# Patient Record
Sex: Female | Born: 1980 | Race: Black or African American | Hispanic: No | Marital: Single | Smoking: Never smoker
Health system: Southern US, Community
[De-identification: ages and names within clinical notes are randomized; demographics above are authoritative.]

## PROBLEM LIST (undated history)

## (undated) DIAGNOSIS — E119 Type 2 diabetes mellitus without complications: Secondary | ICD-10-CM

## (undated) DIAGNOSIS — R112 Nausea with vomiting, unspecified: Secondary | ICD-10-CM

## (undated) DIAGNOSIS — F32A Depression, unspecified: Secondary | ICD-10-CM

## (undated) DIAGNOSIS — J45909 Unspecified asthma, uncomplicated: Secondary | ICD-10-CM

## (undated) DIAGNOSIS — F329 Major depressive disorder, single episode, unspecified: Secondary | ICD-10-CM

## (undated) DIAGNOSIS — K219 Gastro-esophageal reflux disease without esophagitis: Secondary | ICD-10-CM

## (undated) HISTORY — PX: TUBAL LIGATION: SHX77

---
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
## 2015-09-25 ENCOUNTER — Encounter: Payer: Self-pay | Admitting: Physician Assistant

## 2015-09-25 ENCOUNTER — Ambulatory Visit: Payer: Self-pay | Admitting: Physician Assistant

## 2015-09-25 VITALS — BP 120/89 | HR 92 | Temp 98.3°F

## 2015-09-25 DIAGNOSIS — J069 Acute upper respiratory infection, unspecified: Secondary | ICD-10-CM

## 2015-09-25 DIAGNOSIS — R110 Nausea: Secondary | ICD-10-CM

## 2015-09-25 MED ORDER — AZITHROMYCIN 250 MG PO TABS: ORAL_TABLET | ORAL | Status: DC

## 2015-09-25 MED ORDER — FLUTICASONE PROPIONATE 50 MCG/ACT NA SUSP: 2.0000 | Freq: Every day | NASAL | Status: DC

## 2015-09-25 MED ORDER — BENZONATATE 100 MG PO CAPS: 200.0000 mg | ORAL_CAPSULE | Freq: Three times a day (TID) | ORAL | Status: DC | PRN

## 2015-09-25 MED ORDER — ONDANSETRON HCL 4 MG PO TABS: 4.0000 mg | ORAL_TABLET | Freq: Three times a day (TID) | ORAL | Status: DC | PRN

## 2015-09-25 NOTE — Progress Notes (Signed)
S: C/o runny nose and congestion for 3 weeks, no fever, chills now but did have same at beginning of sx; denies cp/sob, v/d; mucus was green this am but clear throughout the day, cough is sporadic, is having some nausea and having to clear her throat alot  Using otc meds without relief  O: PE: perrl eomi, normocephalic, tms dull, nasal mucosa red and swollen, throat injected, neck supple no lymph, lungs c t a, cv rrr, neuro intact, cough is dry  A:  Acute uri, nausea without vomiting  P: zpack, flonase, tessalon perls, zofran drink fluids, continue regular meds , use otc meds of choice, return if not improving in 5 days, return earlier if worsening

## 2016-01-01 ENCOUNTER — Other Ambulatory Visit: Payer: Self-pay | Admitting: Physician Assistant

## 2016-07-15 ENCOUNTER — Encounter: Payer: Self-pay | Admitting: Physician Assistant

## 2016-07-15 ENCOUNTER — Ambulatory Visit: Payer: Self-pay | Admitting: Physician Assistant

## 2016-07-15 VITALS — BP 117/80 | HR 85 | Temp 98.3°F

## 2016-07-15 DIAGNOSIS — R110 Nausea: Secondary | ICD-10-CM

## 2016-07-15 DIAGNOSIS — Z299 Encounter for prophylactic measures, unspecified: Secondary | ICD-10-CM

## 2016-07-15 LAB — POCT URINALYSIS DIPSTICK
Bilirubin, UA: NEGATIVE
GLUCOSE UA: NEGATIVE
KETONES UA: NEGATIVE
LEUKOCYTES UA: NEGATIVE
Nitrite, UA: NEGATIVE
Protein, UA: NEGATIVE
SPEC GRAV UA: 1.025
Urobilinogen, UA: 1
pH, UA: 6

## 2016-07-15 MED ORDER — ONDANSETRON HCL 4 MG PO TABS: 4.0000 mg | ORAL_TABLET | Freq: Three times a day (TID) | ORAL | 0 refills | Status: DC | PRN

## 2016-07-15 NOTE — Progress Notes (Signed)
S: c/o nausea, bump on upper lid, yesterday bump was worse , has cleared up today, nausea on and off, worse around certain foods, no ruq pain, similar sx last year at this time, no actual vomiting, no diarrhea, no abdominal pain, hx of hyst in 2011, hx of tachycardia several years ago which resolved, family hx dm, htn, thyroid problems, breast ca, leukemia  O: vitals wnl, nad, ENT wnl, neck supple no lymph, lungs c t a, cv rrr, abd soft nontender bs normal all 4 quads, ua wnl  A: nausea,   P: zofran 4mg  , fasting labs drawn today

## 2016-07-16 LAB — CMP12+LP+TP+TSH+6AC+CBC/D/PLT
A/G RATIO: 1.2 (ref 1.2–2.2)
ALT: 29 IU/L (ref 0–32)
AST: 25 IU/L (ref 0–40)
Albumin: 4.3 g/dL (ref 3.5–5.5)
Alkaline Phosphatase: 91 IU/L (ref 39–117)
BASOS ABS: 0 10*3/uL (ref 0.0–0.2)
BUN / CREAT RATIO: 15 (ref 9–23)
BUN: 12 mg/dL (ref 6–20)
Basos: 0 %
Bilirubin Total: <0.2 mg/dL (ref 0.0–1.2)
CHLORIDE: 101 mmol/L (ref 96–106)
CHOLESTEROL TOTAL: 257 mg/dL — AB (ref 100–199)
CREATININE: 0.81 mg/dL (ref 0.57–1.00)
Calcium: 9.6 mg/dL (ref 8.7–10.2)
Chol/HDL Ratio: 3.6 ratio units (ref 0.0–4.4)
EOS (ABSOLUTE): 0.2 10*3/uL (ref 0.0–0.4)
EOS: 2 %
Estimated CHD Risk: 0.6 times avg. (ref 0.0–1.0)
Free Thyroxine Index: 2.2 (ref 1.2–4.9)
GFR calc Af Amer: 110 mL/min/{1.73_m2} (ref >59)
GFR, EST NON AFRICAN AMERICAN: 95 mL/min/{1.73_m2} (ref >59)
GGT: 26 IU/L (ref 0–60)
GLUCOSE: 104 mg/dL — AB (ref 65–99)
Globulin, Total: 3.6 g/dL (ref 1.5–4.5)
HDL: 71 mg/dL (ref >39)
Hematocrit: 40 % (ref 34.0–46.6)
Hemoglobin: 12.6 g/dL (ref 11.1–15.9)
IRON: 43 ug/dL (ref 27–159)
Immature Grans (Abs): 0 10*3/uL (ref 0.0–0.1)
Immature Granulocytes: 0 %
LDH: 233 IU/L — ABNORMAL HIGH (ref 119–226)
LDL Calculated: 168 mg/dL — ABNORMAL HIGH (ref 0–99)
LYMPHS ABS: 3.5 10*3/uL — AB (ref 0.7–3.1)
Lymphs: 30 %
MCH: 24.4 pg — AB (ref 26.6–33.0)
MCHC: 31.5 g/dL (ref 31.5–35.7)
MCV: 78 fL — ABNORMAL LOW (ref 79–97)
MONOS ABS: 0.6 10*3/uL (ref 0.1–0.9)
Monocytes: 5 %
Neutrophils Absolute: 7.5 10*3/uL — ABNORMAL HIGH (ref 1.4–7.0)
Neutrophils: 63 %
PHOSPHORUS: 3.9 mg/dL (ref 2.5–4.5)
PLATELETS: 306 10*3/uL (ref 150–379)
Potassium: 4.7 mmol/L (ref 3.5–5.2)
RBC: 5.16 x10E6/uL (ref 3.77–5.28)
RDW: 16.8 % — ABNORMAL HIGH (ref 12.3–15.4)
Sodium: 141 mmol/L (ref 134–144)
T3 UPTAKE RATIO: 26 % (ref 24–39)
T4 TOTAL: 8.4 ug/dL (ref 4.5–12.0)
TOTAL PROTEIN: 7.9 g/dL (ref 6.0–8.5)
TSH: 3.02 u[IU]/mL (ref 0.450–4.500)
Triglycerides: 90 mg/dL (ref 0–149)
URIC ACID: 7.5 mg/dL — AB (ref 2.5–7.1)
VLDL Cholesterol Cal: 18 mg/dL (ref 5–40)
WBC: 11.9 10*3/uL — ABNORMAL HIGH (ref 3.4–10.8)

## 2016-07-16 LAB — VITAMIN D 25 HYDROXY (VIT D DEFICIENCY, FRACTURES): VIT D 25 HYDROXY: 7.1 ng/mL — AB (ref 30.0–100.0)

## 2016-07-16 LAB — HGB A1C W/O EAG: HEMOGLOBIN A1C: 6 % — AB (ref 4.8–5.6)

## 2016-07-17 ENCOUNTER — Encounter: Payer: Self-pay | Admitting: Emergency Medicine

## 2016-09-13 ENCOUNTER — Encounter (HOSPITAL_COMMUNITY): Payer: Self-pay | Admitting: *Deleted

## 2016-09-13 ENCOUNTER — Emergency Department (HOSPITAL_COMMUNITY): Payer: Managed Care, Other (non HMO)

## 2016-09-13 ENCOUNTER — Emergency Department (HOSPITAL_COMMUNITY)
Admission: EM | Admit: 2016-09-13 | Discharge: 2016-09-13 | Disposition: A | Discharge status: Home / Self Care | Payer: Managed Care, Other (non HMO) | Attending: Emergency Medicine | Admitting: Emergency Medicine

## 2016-09-13 DIAGNOSIS — R05 Cough: Secondary | ICD-10-CM | POA: Diagnosis not present

## 2016-09-13 DIAGNOSIS — J45909 Unspecified asthma, uncomplicated: Secondary | ICD-10-CM | POA: Diagnosis not present

## 2016-09-13 DIAGNOSIS — R9431 Abnormal electrocardiogram [ECG] [EKG]: Secondary | ICD-10-CM | POA: Diagnosis not present

## 2016-09-13 DIAGNOSIS — R0600 Dyspnea, unspecified: Secondary | ICD-10-CM | POA: Diagnosis not present

## 2016-09-13 DIAGNOSIS — R0602 Shortness of breath: Secondary | ICD-10-CM | POA: Diagnosis present

## 2016-09-13 DIAGNOSIS — R059 Cough, unspecified: Secondary | ICD-10-CM

## 2016-09-13 HISTORY — DX: Unspecified asthma, uncomplicated: J45.909

## 2016-09-13 LAB — BASIC METABOLIC PANEL
Anion gap: 10 (ref 5–15)
BUN: 10 mg/dL (ref 6–20)
CALCIUM: 9.1 mg/dL (ref 8.9–10.3)
CO2: 23 mmol/L (ref 22–32)
CREATININE: 0.94 mg/dL (ref 0.44–1.00)
Chloride: 106 mmol/L (ref 101–111)
Glucose, Bld: 123 mg/dL — ABNORMAL HIGH (ref 65–99)
Potassium: 4 mmol/L (ref 3.5–5.1)
SODIUM: 139 mmol/L (ref 135–145)

## 2016-09-13 LAB — CBC
HCT: 40.7 % (ref 36.0–46.0)
Hemoglobin: 12.7 g/dL (ref 12.0–15.0)
MCH: 24.9 pg — ABNORMAL LOW (ref 26.0–34.0)
MCHC: 31.2 g/dL (ref 30.0–36.0)
MCV: 79.8 fL (ref 78.0–100.0)
PLATELETS: 310 10*3/uL (ref 150–400)
RBC: 5.1 MIL/uL (ref 3.87–5.11)
RDW: 15.4 % (ref 11.5–15.5)
WBC: 13.3 10*3/uL — AB (ref 4.0–10.5)

## 2016-09-13 LAB — I-STAT TROPONIN, ED
TROPONIN I, POC: 0 ng/mL (ref 0.00–0.08)
Troponin i, poc: 0 ng/mL (ref 0.00–0.08)

## 2016-09-13 LAB — D-DIMER, QUANTITATIVE (NOT AT ARMC): D DIMER QUANT: 0.36 ug{FEU}/mL (ref 0.00–0.50)

## 2016-09-13 NOTE — Discharge Instructions (Signed)
If you were given medicines take as directed.  If you are on coumadin or contraceptives realize their levels and effectiveness is altered by many different medicines.  If you have any reaction (rash, tongues swelling, other) to the medicines stop taking and see a physician.    If your blood pressure was elevated in the ER make sure you follow up for management with a primary doctor or return for chest pain, shortness of breath or stroke symptoms.  Please follow up as directed and return to the ER or see a physician for new or worsening symptoms.  Thank you. Vitals:   09/13/16 1451 09/13/16 1454  BP: (!) 154/101   Pulse: 120   Resp: 22   Temp: 98.4 F (36.9 C)   TempSrc: Oral   SpO2: 100% 100%  Weight: 265 lb (120.2 kg)   Height: 5\' 3"  (1.6 m)

## 2016-09-13 NOTE — ED Provider Notes (Signed)
[facility]-EMERGENCY DEPT Provider Note   CSN: [id number] Arrival date & time: 09/13/16  1442     History   Chief Complaint Chief Complaint  Patient presents with  . Shortness of Breath  . Cough    HPI Maureen Ferguson is a 35 y.o. female.  Patient presents with shortness of breath and cough after breathing in cigarette smoke today. History of asthma does not use inhalers in years. Heart rate 130s in triage. Patient denies any classic blood clot risk factors. No recent leg swelling. Patient has improved with time. No blood clot history. Patient or monitor and was on medicine for heart rate in the past however is not on any medicines currently does not really follow the heart doctor. Pleuritic component to mild bilateral anterior discomfort.       Past Medical History:  Diagnosis Date  . Asthma     There are no active problems to display for this patient.   Past Surgical History:  Procedure Laterality Date  . ABDOMINAL HYSTERECTOMY      OB History    No data available       Home Medications    Prior to Admission medications   Medication Sig Start Date End Date Taking? Authorizing Provider  fluticasone (FLONASE) 50 MCG/ACT nasal spray Place 2 sprays into both nostrils daily. Patient not taking: Reported on 09/13/2016 09/25/15   Faythe Ghee, PA-C  ondansetron (ZOFRAN) 4 MG tablet Take 1 tablet (4 mg total) by mouth every 8 (eight) hours as needed for nausea or vomiting. Patient not taking: Reported on 09/13/2016 07/15/16   Faythe Ghee, PA-C    Family History History reviewed. No pertinent family history.  Social History Social History  Substance Use Topics  . Smoking status: Never Smoker  . Smokeless tobacco: Not on file  . Alcohol use No     Allergies   Review of patient's allergies indicates no known allergies.   Review of Systems Review of Systems  Constitutional: Negative for chills and fever.  HENT: Negative for congestion.   Eyes: Negative for  visual disturbance.  Respiratory: Positive for cough and shortness of breath.   Cardiovascular: Negative for chest pain.  Gastrointestinal: Negative for abdominal pain and vomiting.  Genitourinary: Negative for dysuria and flank pain.  Musculoskeletal: Negative for back pain, neck pain and neck stiffness.  Skin: Negative for rash.  Neurological: Negative for light-headedness and headaches.     Physical Exam Updated Vital Signs BP 125/70   Pulse 101   Temp 98.4 F (36.9 C) (Oral)   Resp 22   Ht 5\' 3"  (1.6 m)   Wt 265 lb (120.2 kg)   SpO2 97%   BMI 46.94 kg/m   Physical Exam  Constitutional: She is oriented to person, place, and time. She appears well-developed and well-nourished.  HENT:  Head: Normocephalic and atraumatic.  Eyes: Conjunctivae are normal. Right eye exhibits no discharge. Left eye exhibits no discharge.  Neck: Normal range of motion. Neck supple. No tracheal deviation present.  Cardiovascular: Regular rhythm.   Pulmonary/Chest: Effort normal and breath sounds normal.  Abdominal: Soft. She exhibits no distension. There is no tenderness. There is no guarding.  Musculoskeletal: She exhibits no edema or tenderness.  Neurological: She is alert and oriented to person, place, and time.  Skin: Skin is warm. No rash noted.  Psychiatric: She has a normal mood and affect.  Nursing note and vitals reviewed.    ED Treatments / Results  Labs (all labs ordered  are listed, but only abnormal results are displayed) Labs Reviewed  BASIC METABOLIC PANEL - Abnormal; Notable for the following:       Result Value   Glucose, Bld 123 (*)    All other components within normal limits  CBC - Abnormal; Notable for the following:    WBC 13.3 (*)    MCH 24.9 (*)    All other components within normal limits  D-DIMER, QUANTITATIVE (NOT AT [facility])  Rosezena SensorI-STAT TROPOININ, ED  I-STAT TROPOININ, ED    EKG  EKG Interpretation  Date/Time:  Sunday September 13 2016 14:59:02  EDT Ventricular Rate:  118 PR Interval:  84 QRS Duration: 82 QT Interval:  292 QTC Calculation: 409 R Axis:   82 Text Interpretation:  Sinus tachycardia with short PR T wave inversions inferior and lateral. Confirmed by Jodi MourningZAVITZ MD, Praise Stennett [phone] on 09/13/2016 5:11:17 PM       Radiology Dg Chest 2 View  Result Date: 09/13/2016 CLINICAL DATA:  Shortness of breath and cough EXAM: CHEST  2 VIEW COMPARISON:  None. FINDINGS: Normal heart size. Normal mediastinal contour. No pneumothorax. No pleural effusion. Lungs appear clear, with no acute consolidative airspace disease and no pulmonary edema. IMPRESSION: No active cardiopulmonary disease. Electronically Signed   By: Delbert PhenixJason A Poff M.D.   On: 09/13/2016 15:58    Procedures Procedures (including critical care time)  Medications Ordered in ED Medications - No data to display   Initial Impression / Assessment and Plan / ED Course  I have reviewed the triage vital signs and the nursing notes.  Pertinent labs & imaging results that were available during my care of the patient were reviewed by me and considered in my medical decision making (see chart for details).  Clinical Course   Overall healthy female presents with pleuritic chest discomfort shows of breath and cough. Clinically with persistent cough since exposure to smoke this is likely the cause/pneumonitis. Patient improved in the room heart rate no longer tachycardic. With significant tachycardia on arrival, pleuritic component, plan for d-dimer. Patient has abnormal EKG, patient has been told she has abnormal EKG however she does not recall details and I do not have a copy of an old EKG. Discussed outpatient follow with cardiology. Her symptoms are not consistent with cardiac.  Results and differential diagnosis were discussed with the patient/parent/guardian. Xrays were independently reviewed by myself.  Close follow up outpatient was discussed, comfortable with the plan.    Medications - No data to display  Vitals:   09/13/16 1845 09/13/16 1900 09/13/16 1930 09/13/16 2000  BP: 116/69 120/77 (!) 122/110 125/70  Pulse: 80 91 82 101  Resp: 19 17 18 22   Temp:      TempSrc:      SpO2: 98% 100% 99% 97%  Weight:      Height:        Final diagnoses:  Cough  Dyspnea  EKG, abnormal     Final Clinical Impressions(s) / ED Diagnoses   Final diagnoses:  Cough  Dyspnea  EKG, abnormal    New Prescriptions New Prescriptions   No medications on file     Blane OharaJoshua Cathy Crounse, MD 09/13/16 2040

## 2016-09-13 NOTE — ED Notes (Signed)
Pt alert no distress  No pain

## 2016-09-13 NOTE — ED Triage Notes (Signed)
Pt reports unable to catch her breath today after being around cigarette smoke. Hx of asthma but has not used inhalers in years. Has on productive cough. HR 130 at triage but reports her baseline is always around 120's. ekg done. spo2 100% at triage.

## 2016-09-15 ENCOUNTER — Ambulatory Visit: Payer: Self-pay | Admitting: Physician Assistant

## 2017-04-07 ENCOUNTER — Emergency Department
Admission: EM | Admit: 2017-04-07 | Discharge: 2017-04-07 | Disposition: A | Discharge status: Home / Self Care | Payer: PRIVATE HEALTH INSURANCE | Attending: Student in an Organized Health Care Education/Training Program | Admitting: Student in an Organized Health Care Education/Training Program

## 2017-04-07 ENCOUNTER — Encounter: Payer: Self-pay | Admitting: Emergency Medicine

## 2017-04-07 ENCOUNTER — Emergency Department: Payer: PRIVATE HEALTH INSURANCE

## 2017-04-07 DIAGNOSIS — J45909 Unspecified asthma, uncomplicated: Secondary | ICD-10-CM | POA: Insufficient documentation

## 2017-04-07 DIAGNOSIS — M2540 Effusion, unspecified joint: Secondary | ICD-10-CM

## 2017-04-07 DIAGNOSIS — M25562 Pain in left knee: Secondary | ICD-10-CM | POA: Diagnosis present

## 2017-04-07 DIAGNOSIS — M25462 Effusion, left knee: Secondary | ICD-10-CM | POA: Insufficient documentation

## 2017-04-07 NOTE — ED Triage Notes (Signed)
Pt ambulatory with slow gait to triage. Pt c/o of left knee pain and "throbbing" sensation when she walks. Pt denies injury to area but sts she felt the pain after walking up 3 flights of stair today. Swelling noted to left knee.

## 2017-04-07 NOTE — ED Provider Notes (Signed)
[facility] Emergency Department Provider Note  ____________________________________________  Time seen: Approximately 8:40 PM  I have reviewed the triage vital signs and the nursing notes.   HISTORY  Chief Complaint Knee Pain    HPI Maureen Ferguson is a 36 y.o. female that presents to the emergency department with left knee pain for 2 weeks that worsened today. Patient states that she was walking up a flight of stairs when she her knee gave out. She was able to catch herself and did not fall to the ground. Patient recently started a new job and has been walking up 3 flights of stairs. Patient states that there is an elevator but it is only reserved for people with medical conditions. She denies any additional injuries. No fever, shortness of breath, chest pain, nausea, vomiting, abdominal pain, numbness, tingling, rash.   Past Medical History:  Diagnosis Date  . Asthma     There are no active problems to display for this patient.   Past Surgical History:  Procedure Laterality Date  . ABDOMINAL HYSTERECTOMY      Prior to Admission medications   Not on File    Allergies Patient has no known allergies.  History reviewed. No pertinent family history.  Social History Social History  Substance Use Topics  . Smoking status: Never Smoker  . Smokeless tobacco: Never Used  . Alcohol use No     Review of Systems  Constitutional: No fever/chills Cardiovascular: No chest pain. Respiratory: No cough. No SOB. Gastrointestinal: No abdominal pain.  No nausea, no vomiting.  Musculoskeletal: Positive for left knee pain. Skin: Negative for rash, abrasions, lacerations, ecchymosis. Neurological: Negative for headaches, numbness or tingling   ____________________________________________   PHYSICAL EXAM:  VITAL SIGNS: ED Triage Vitals  Enc Vitals Group     BP 04/07/17 1914 (!) 157/96     Pulse Rate 04/07/17 1914 90     Resp 04/07/17 1914 16      Temp 04/07/17 1914 98 F (36.7 C)     Temp Source 04/07/17 1914 Oral     SpO2 04/07/17 1914 98 %     Weight 04/07/17 1912 265 lb (120.2 kg)     Height 04/07/17 1912  (1.6 m)     Head Circumference --      Peak Flow --      Pain Score --      Pain Loc --      Pain Edu? --      Excl. in GC? --      Constitutional: Alert and oriented. Well appearing and in no acute distress. Eyes: Conjunctivae are normal. PERRL. EOMI. Head: Atraumatic. ENT:      Ears:      Nose: No congestion/rhinnorhea.      Mouth/Throat: Mucous membranes are moist.  Neck: No stridor.  Cardiovascular: Normal rate, regular rhythm.  Good peripheral circulation. Respiratory: Normal respiratory effort without tachypnea or retractions. Lungs CTAB. Good air entry to the bases with no decreased or absent breath sounds. Musculoskeletal: Full range of motion to all extremities. No gross deformities appreciated. Tenderness to palpation diffusely over anterior knee. No erythema. Mild swelling at superior pole of patella. No crepitus noted. Neurologic:  Normal speech and language. No gross focal neurologic deficits are appreciated.  Skin:  Skin is warm, dry and intact. No rash noted.   ____________________________________________   LABS (all labs ordered are listed, but only abnormal results are displayed)  Labs Reviewed - No data to display ____________________________________________  EKG   ____________________________________________  RADIOLOGY Lexine Baton, personally viewed and evaluated these images (plain radiographs) as part of my medical decision making, as well as reviewing the written report by the radiologist.  Dg Knee Complete 4 Views Left  Result Date: 04/07/2017 CLINICAL DATA:  Left knee pain/swelling EXAM: LEFT KNEE - COMPLETE 4+ VIEW COMPARISON:  None. FINDINGS: No fracture or dislocation is seen. The joint spaces are preserved. Visualized soft tissues are within normal limits. Possible  small suprapatellar knee joint effusion, equivocal. IMPRESSION: No acute osseus abnormality is seen. Possible small suprapatellar knee joint effusion, equivocal. Electronically Signed   By: Charline Bills M.D.   On: 04/07/2017 20:30    ____________________________________________    PROCEDURES  Procedure(s) performed:    Procedures    Medications - No data to display   ____________________________________________   INITIAL IMPRESSION / ASSESSMENT AND PLAN / ED COURSE  Pertinent labs & imaging results that were available during my care of the patient were reviewed by me and considered in my medical decision making (see chart for details).  Review of the [place] CSRS was performed in accordance of the [facility] prior to dispensing any controlled drugs.   Patient's diagnosis is consistent with small joint effusion. Vital signs and exam are reassuring. Knee x-ray consistent with small joint effusion. Symptoms are likely from obesity and recent increase in physical activity. Education was provided and patient is going to limit weight bearing activities for 2 days. She was instructed to do low weight-bearing activities like swimming or bicycling. Knee was ace wrapped and crutches were given. Patient is to follow up with PCP as directed. Patient is given ED precautions to return to the ED for any worsening or new symptoms.     ____________________________________________  FINAL CLINICAL IMPRESSION(S) / ED DIAGNOSES  Final diagnoses:  Joint effusion      NEW MEDICATIONS STARTED DURING THIS VISIT:  Current Discharge Medication List          This chart was dictated using voice recognition software/Dragon. Despite best efforts to proofread, errors can occur which can change the meaning. Any change was purely unintentional.    Enid Derry, PA-C 04/07/17 2154    Willy Eddy, MD 04/09/17 1151

## 2017-04-07 NOTE — ED Notes (Signed)
Pt has left knee pain.  Pt states while walking up stairs at work today, left knee gave out.  Pt did not fall.  Pt has increased pain.

## 2017-11-04 IMAGING — CR DG CHEST 2V
2 series · 2 of 2 positions shown · non-contrast
Comparison: None.

CLINICAL DATA: Shortness of breath and cough

EXAM:
CHEST  2 VIEW

[chest pa]
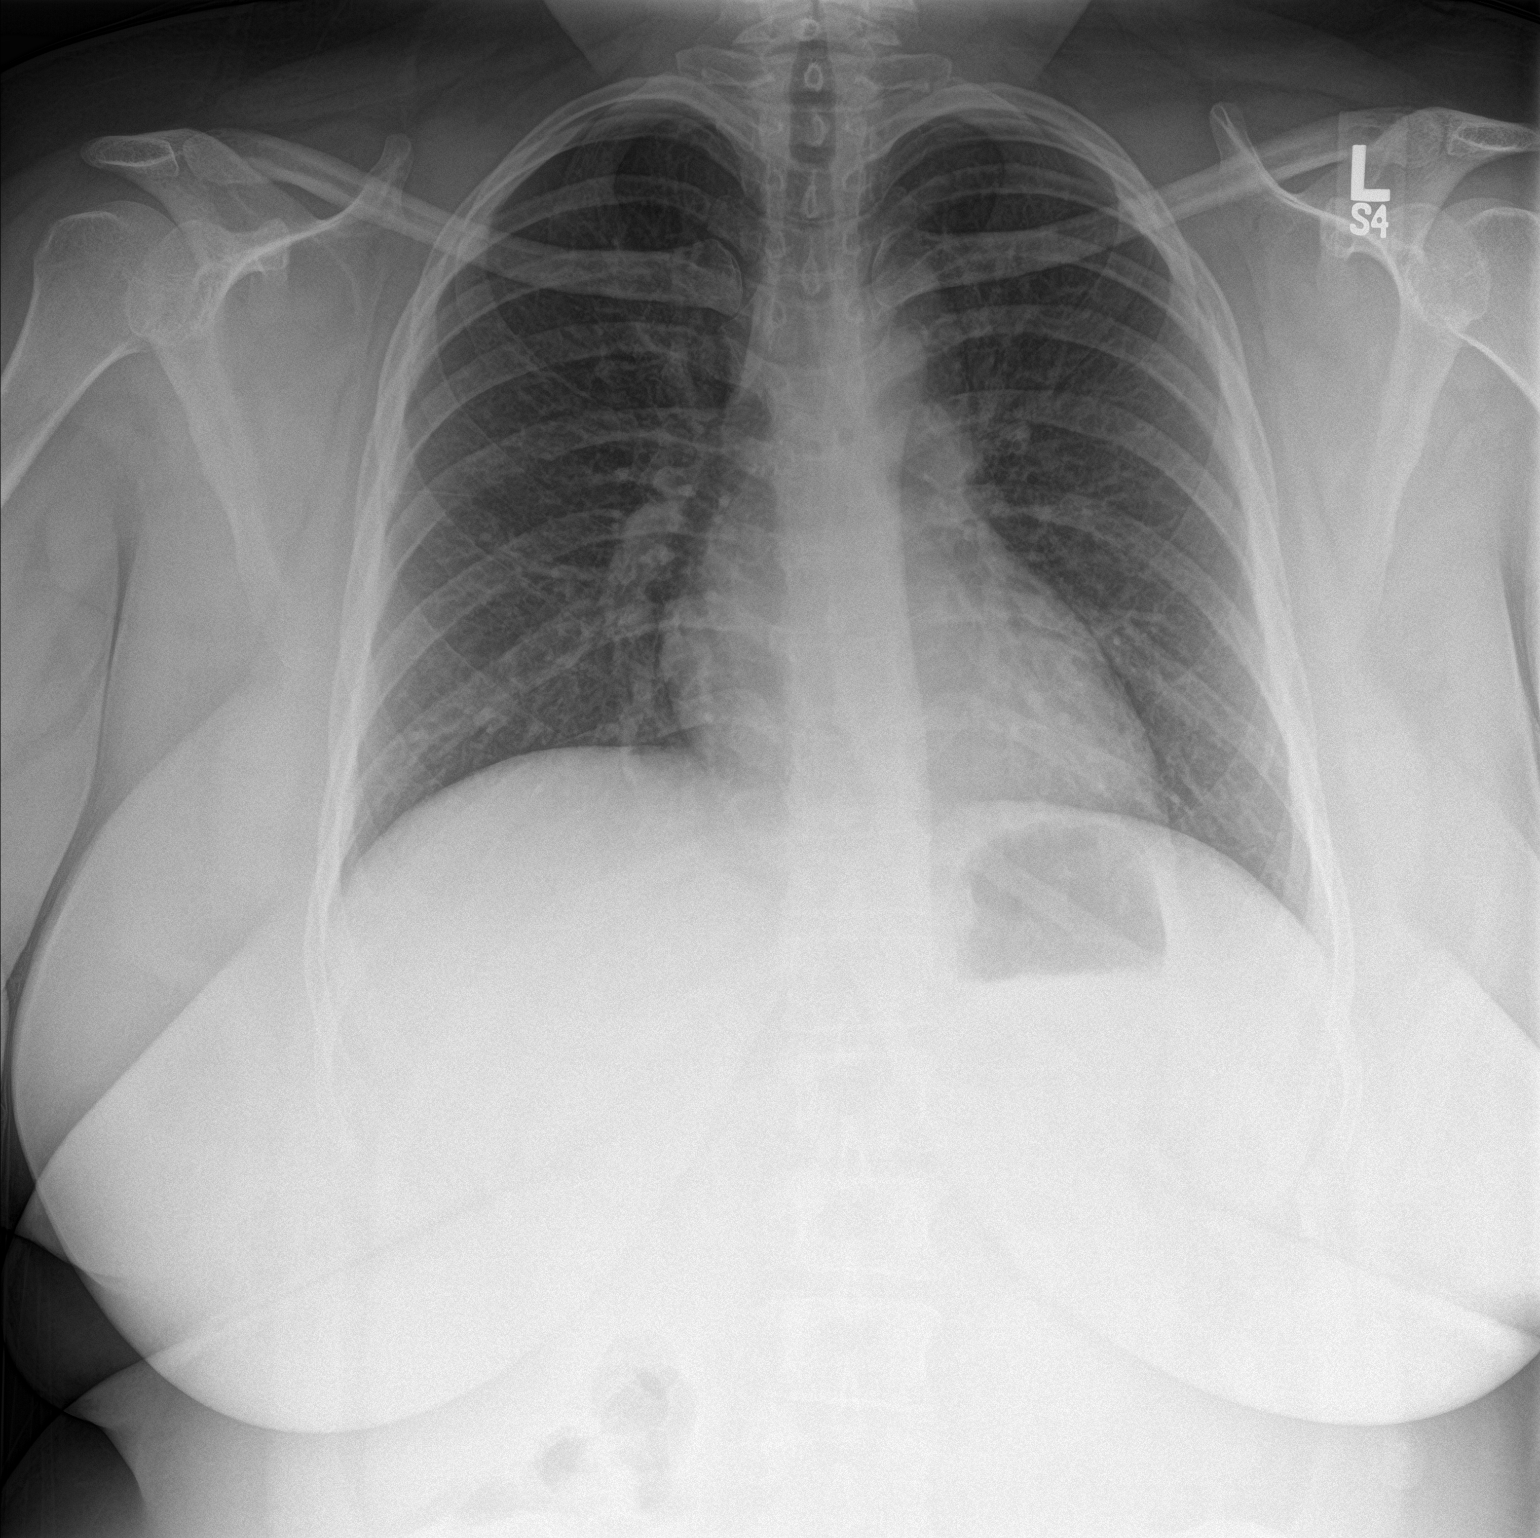

[chest lat]
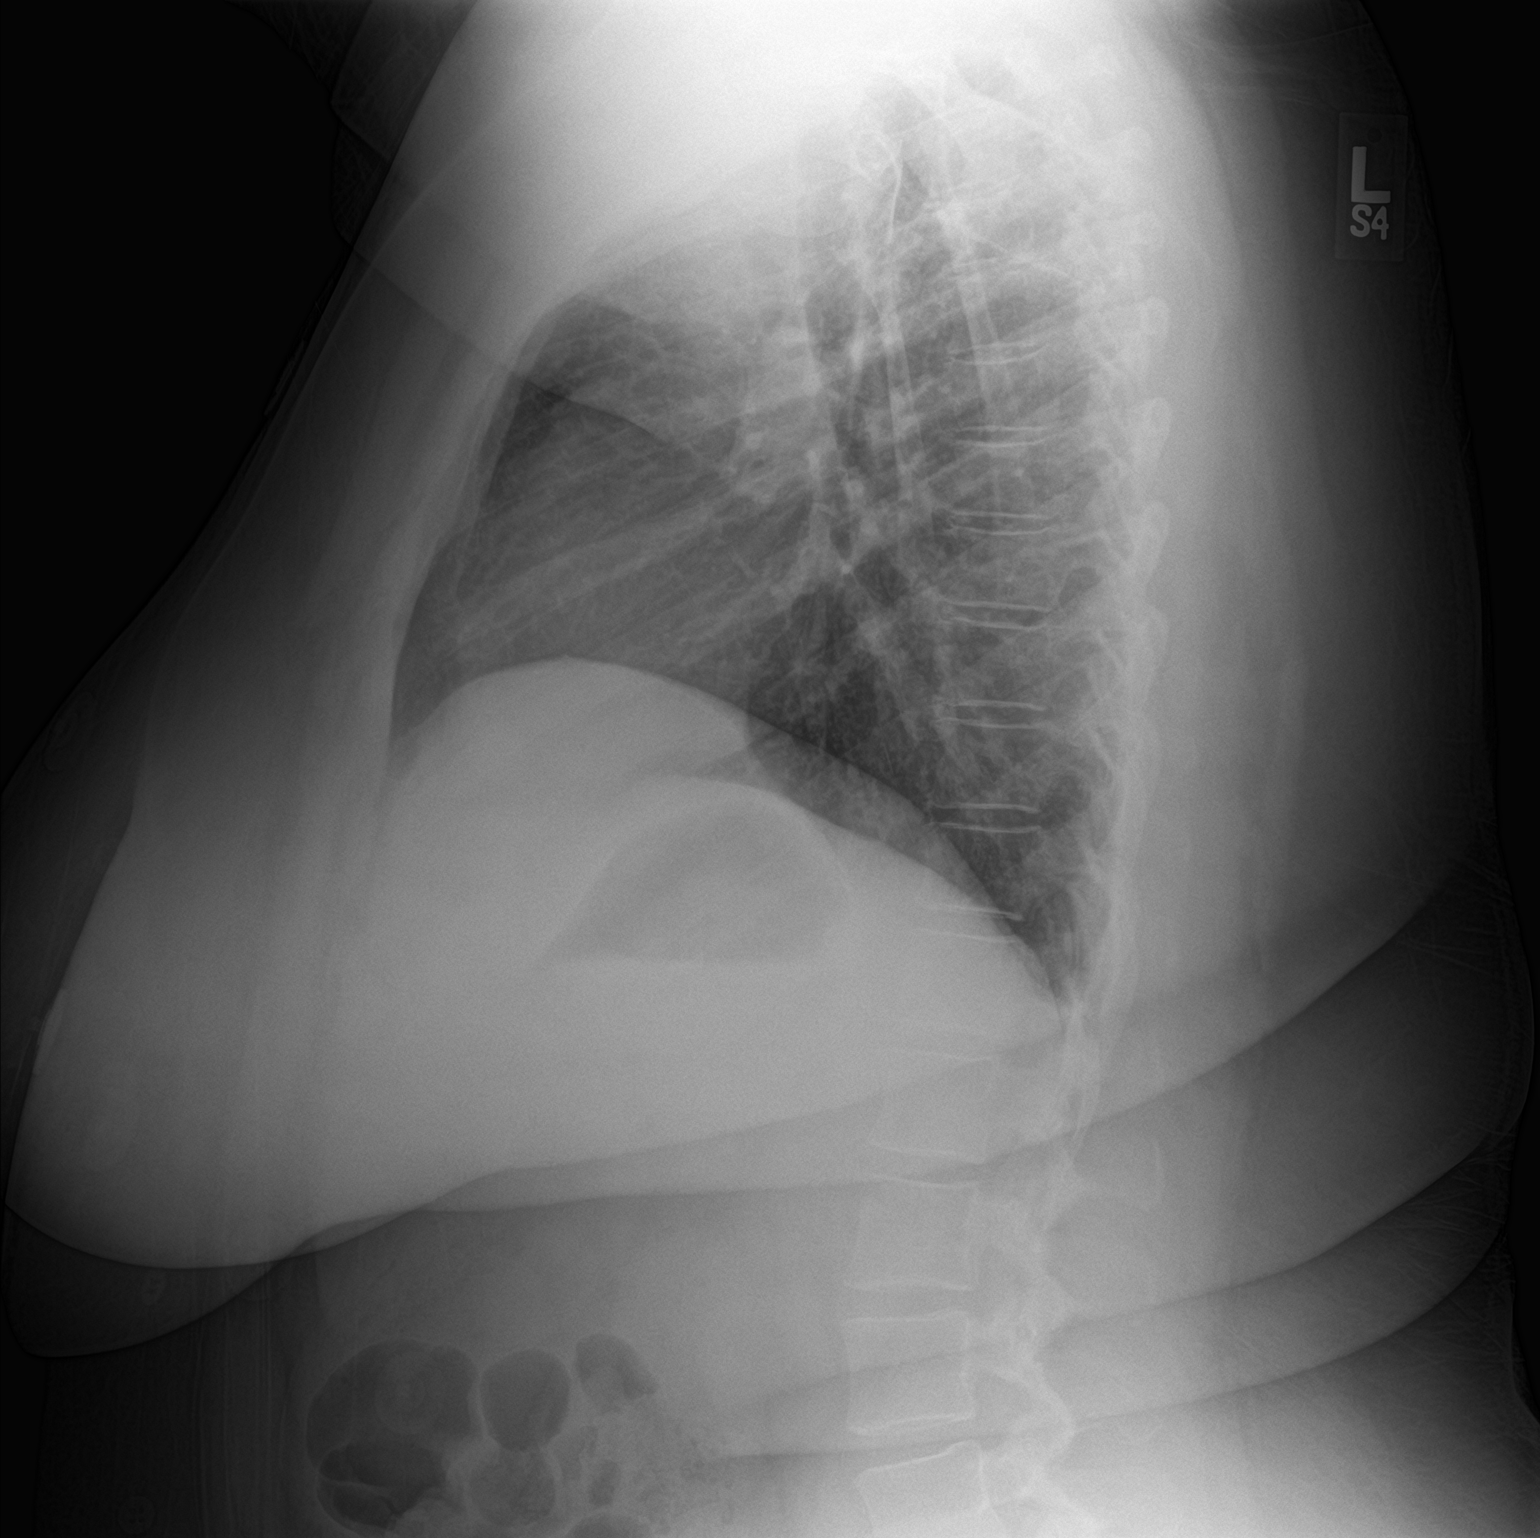

[2 of 2 positions shown; findings below may reference images not displayed]

FINDINGS: Normal heart size. Normal mediastinal contour. No pneumothorax. No
pleural effusion. Lungs appear clear, with no acute consolidative
airspace disease and no pulmonary edema.
IMPRESSION: No active cardiopulmonary disease.

## 2018-07-22 ENCOUNTER — Emergency Department
Admission: EM | Admit: 2018-07-22 | Discharge: 2018-07-22 | Disposition: A | Discharge status: Home / Self Care | Payer: Managed Care, Other (non HMO) | Attending: Emergency Medicine | Admitting: Emergency Medicine

## 2018-07-22 DIAGNOSIS — H6691 Otitis media, unspecified, right ear: Secondary | ICD-10-CM | POA: Insufficient documentation

## 2018-07-22 MED ORDER — AMOXICILLIN-POT CLAVULANATE 875-125 MG PO TABS
1.0000 | ORAL_TABLET | Freq: Once | ORAL | Status: AC
  Administered 2018-07-22: 1 via ORAL
  Filled 2018-07-22: qty 1

## 2018-07-22 MED ORDER — AMOXICILLIN-POT CLAVULANATE 875-125 MG PO TABS: 1.0000 | ORAL_TABLET | Freq: Two times a day (BID) | ORAL | 0 refills | Status: AC

## 2018-07-22 MED ORDER — HYDROCODONE-ACETAMINOPHEN 5-325 MG PO TABS: 1.0000 | ORAL_TABLET | ORAL | 0 refills | Status: DC | PRN

## 2018-07-22 MED ORDER — KETOROLAC TROMETHAMINE 10 MG PO TABS: 10.0000 mg | ORAL_TABLET | Freq: Four times a day (QID) | ORAL | 0 refills | Status: DC | PRN

## 2018-07-22 MED ORDER — KETOROLAC TROMETHAMINE 10 MG PO TABS
10.0000 mg | ORAL_TABLET | Freq: Once | ORAL | Status: AC
  Administered 2018-07-22: 10 mg via ORAL
  Filled 2018-07-22: qty 1

## 2018-07-22 NOTE — ED Provider Notes (Signed)
[facility] Emergency Department Provider Note    First MD Initiated Contact with Patient 07/22/18 [phone]     (approximate)  I have reviewed the triage vital signs and the nursing notes.   HISTORY  Chief Complaint Otalgia   HPI Maureen Ferguson is a 36 y.o. female presents to the emergency department with 1 month history of right earache.  Patient states that pain is worsened in intensity over the past 2 days.  Patient denies any fever.  Patient admits to fullness and "popping sounds from the right ear.  Patient admits to occasional dizziness as well.  Patient admits to nausea however no vomiting.  No headache.  She denies any tinnitus or hearing loss.  Past medical history None   There are no active problems to display for this patient.   Past Surgical History:  Procedure Laterality Date  . TUBAL LIGATION      Prior to Admission medications   Not on File    Allergies No known drug allergies No family history on file.  Social History Social History   Tobacco Use  . Smoking status: Never Smoker  . Smokeless tobacco: Never Used  Substance Use Topics  . Alcohol use: No    Alcohol/week: 0.0 oz  . Drug use: No    Review of Systems Constitutional: No fever/chills Eyes: No visual changes. ENT: No sore throat. Positive for Right earache Cardiovascular: Denies chest pain. Respiratory: Denies shortness of breath. Gastrointestinal: No abdominal pain.  No nausea, no vomiting.  No diarrhea.  No constipation. Genitourinary: Negative for dysuria. Musculoskeletal: Negative for neck pain.  Negative for back pain. Integumentary: Negative for rash. Neurological: Negative for headaches, focal weakness or numbness.   ____________________________________________   PHYSICAL EXAM:  VITAL SIGNS: ED Triage Vitals  Enc Vitals Group     BP 07/22/18 0252 (!) 149/102     Pulse Rate 07/22/18 0252 98     Resp 07/22/18 0252 18     Temp 07/22/18 0252  98.5 F (36.9 C)     Temp Source 07/22/18 0252 Oral     SpO2 07/22/18 0252 96 %     Weight 07/22/18 0253 125.2 kg (276 lb)     Height 07/22/18 0253 1.6 m (5\' 3" )     Head Circumference --      Peak Flow --      Pain Score 07/22/18 0253 10     Pain Loc --      Pain Edu? --      Excl. in GC? --     Constitutional: Alert and oriented. Well appearing and in no acute distress. Eyes: Conjunctivae are normal. PERRL. EOMI. Ears: External auditory canal normal.  Posterior bulging and dullness of the right ear Head: Atraumatic. Mouth/Throat: Mucous membranes are moist.  Oropharynx non-erythematous. Neck: No stridor.   Cardiovascular: Normal rate, regular rhythm. Good peripheral circulation. Grossly normal heart sounds. Respiratory: Normal respiratory effort.  No retractions. Lungs CTAB. Gastrointestinal: Soft and nontender. No distention.  Musculoskeletal: No lower extremity tenderness nor edema. No gross deformities of extremities. Neurologic:  Normal speech and language. No gross focal neurologic deficits are appreciated.  Skin:  Skin is warm, dry and intact. No rash noted. Psychiatric: Mood and affect are normal. Speech and behavior are normal.   Procedures   ____________________________________________   INITIAL IMPRESSION / ASSESSMENT AND PLAN / ED COURSE  As part of my medical decision making, I reviewed the following data within the electronic MEDICAL RECORD NUMBER\  37 year old female presented with above-stated history and physical exam consistent with acute right otitis media.  Patient given Augmentin in the emergency department will be prescribed same for home.  Patient referred to Dr. Andee PolesVaught for outpatient follow-up ____________________________________________  FINAL CLINICAL IMPRESSION(S) / ED DIAGNOSES  Final diagnoses:  Right otitis media, unspecified otitis media type     MEDICATIONS GIVEN DURING THIS VISIT:  Medications  amoxicillin-clavulanate (AUGMENTIN)  875-125 MG per tablet 1 tablet (has no administration in time range)  ketorolac (TORADOL) tablet 10 mg (has no administration in time range)     ED Discharge Orders    None       Note:  This document was prepared using Dragon voice recognition software and may include unintentional dictation errors.    Darci CurrentBrown, [place] N, MD 07/22/18 [phone]

## 2018-07-22 NOTE — ED Triage Notes (Signed)
Patient c/o right ear pain X 1 month, with increasing severity in the last 2 days. Patient reports the pain woke her from her sleep this morning. Patient reports she just changed insurance, and was trying to wait to until she would be covered by her new insurance to see a doctor, however, the pain became too severe.

## 2018-12-22 ENCOUNTER — Emergency Department
Admission: EM | Admit: 2018-12-22 | Discharge: 2018-12-22 | Disposition: A | Discharge status: Home / Self Care | Payer: Managed Care, Other (non HMO) | Attending: Student in an Organized Health Care Education/Training Program | Admitting: Student in an Organized Health Care Education/Training Program

## 2018-12-22 ENCOUNTER — Other Ambulatory Visit: Payer: Self-pay

## 2018-12-22 DIAGNOSIS — R6884 Jaw pain: Secondary | ICD-10-CM | POA: Diagnosis present

## 2018-12-22 DIAGNOSIS — S0300XA Dislocation of jaw, unspecified side, initial encounter: Secondary | ICD-10-CM

## 2018-12-22 DIAGNOSIS — M26602 Left temporomandibular joint disorder, unspecified: Secondary | ICD-10-CM | POA: Diagnosis not present

## 2018-12-22 MED ORDER — KETOROLAC TROMETHAMINE 30 MG/ML IJ SOLN
30.0000 mg | Freq: Once | INTRAMUSCULAR | Status: AC
  Administered 2018-12-22: 30 mg via INTRAMUSCULAR
  Filled 2018-12-22: qty 1

## 2018-12-22 MED ORDER — MELOXICAM 15 MG PO TABS: 15.0000 mg | ORAL_TABLET | Freq: Every day | ORAL | 0 refills | Status: AC

## 2018-12-22 NOTE — ED Triage Notes (Signed)
Pt states she noticed her jaw hurt while eating yesterday. States today when she went to brush her teeth, not able to open her mouth all the way.

## 2018-12-22 NOTE — ED Notes (Signed)
See triage note  Presents with right jaw pain  States she felt pain yesterday when was eating  States she noticed increased pain with chewing    Unable to open mouth states this has happened in past but did not cause pain  This time having more pain

## 2018-12-22 NOTE — ED Provider Notes (Signed)
[facility] Emergency Department Provider Note  ____________________________________________   None    (approximate)  I have reviewed the triage vital signs and the nursing notes.   HISTORY  Chief Complaint Jaw Pain   HPI Maureen Ferguson is a 38 y.o. female presents to the ED with complaint of right-sided jaw pain while eating yesterday.  Patient states that this morning she went to brush her teeth and was unable to open her mouth without pain.  She denies any dental injury or cavities.  She states "it is not my teeth".  She has not taken any over-the-counter medication for her pain.  She denies any previous injury to her mandible.  She rates her pain as 7 out of 10.  History reviewed. No pertinent past medical history.  There are no active problems to display for this patient.   Past Surgical History:  Procedure Laterality Date  . TUBAL LIGATION      Prior to Admission medications   Medication Sig Start Date End Date Taking? Authorizing Provider  meloxicam (MOBIC) 15 MG tablet Take 1 tablet (15 mg total) by mouth daily. 12/22/18 12/22/19  Tommi RumpsSummers, Ashir Kunz L, PA-C    Allergies Patient has no known allergies.  No family history on file.  Social History Social History   Tobacco Use  . Smoking status: Never Smoker  . Smokeless tobacco: Never Used  Substance Use Topics  . Alcohol use: No    Alcohol/week: 0.0 standard drinks  . Drug use: No    Review of Systems Constitutional: No fever/chills Eyes: No visual changes. ENT: No sore throat.  Positive for right mandibular pain.  Negative for dental pain.  Negative for ear pain. Cardiovascular: Denies chest pain. Respiratory: Denies shortness of breath. Gastrointestinal:  No nausea, no vomiting.   Musculoskeletal: Positive for right mandibular pain. Skin: Negative for rash. Neurological: Negative for headaches, focal weakness or  numbness. ____________________________________________   PHYSICAL EXAM:  VITAL SIGNS: ED Triage Vitals  Enc Vitals Group     BP 12/22/18 0814 (!) 144/93     Pulse Rate 12/22/18 0814 80     Resp 12/22/18 0814 18     Temp 12/22/18 0814 98.1 F (36.7 C)     Temp Source 12/22/18 0814 Oral     SpO2 12/22/18 0814 100 %     Weight 12/22/18 0814 265 lb (120.2 kg)     Height 12/22/18 0814 5\' 3"  (1.6 m)     Head Circumference --      Peak Flow --      Pain Score 12/22/18 0821 7     Pain Loc --      Pain Edu? --      Excl. in [place]? --    Constitutional: Alert and oriented. Well appearing and in no acute distress. Eyes: Conjunctivae are normal.  Head: Atraumatic. Nose: No congestion/rhinnorhea. Mouth/Throat: Mucous membranes are moist.  Oropharynx non-erythematous.  No gum edema or obvious cavities are noted.  There is moderate tenderness on palpation of the TMJ area on the right side.  No gross deformity is noted.  Patient is able to open her mouth slightly without discomfort however she experiences moderate pain on the right TMJ area to open widely. Neck: No stridor.  No cervical tenderness on palpation posteriorly. Hematological/Lymphatic/Immunilogical: No cervical lymphadenopathy. Cardiovascular: Normal rate, regular rhythm. Grossly normal heart sounds.  Good peripheral circulation. Respiratory: Normal respiratory effort.  No retractions. Lungs CTAB. Gastrointestinal: Soft and nontender. No distention.  Musculoskeletal: No  lower extremity tenderness nor edema.  No joint effusions. Neurologic:  Normal speech and language. No gross focal neurologic deficits are appreciated.  Skin:  Skin is warm, dry and intact. No rash noted. Psychiatric: Mood and affect are normal. Speech and behavior are normal.  ____________________________________________   LABS (all labs ordered are listed, but only abnormal results are displayed)  Labs Reviewed - No data to display  PROCEDURES  Procedure(s)  performed: None  Procedures  Critical Care performed: No  ____________________________________________   INITIAL IMPRESSION / ASSESSMENT AND PLAN / ED COURSE  As part of my medical decision making, I reviewed the following data within the electronic MEDICAL RECORD NUMBER Notes from prior ED visits and [place] Controlled Substance Database  Patient presents to the ED with complaint of right sided mandibular pain without dental cavity or abscess.  Patient is extremely tender to palpation on the right TMJ area.  Patient was given Toradol 30 mg IM while in the ED.  She was to continue taking meloxicam 15 mg 1 daily with food.  She is to follow-up with her dentist or PCP if any continued problems or if pain medication is needed.  ____________________________________________   FINAL CLINICAL IMPRESSION(S) / ED DIAGNOSES  Final diagnoses:  TMJ (dislocation of temporomandibular joint), initial encounter     ED Discharge Orders         Ordered    meloxicam (MOBIC) 15 MG tablet  Daily     12/22/18 0947           Note:  This document was prepared using Dragon voice recognition software and may include unintentional dictation errors.    Tommi Rumps, PA-C 12/22/18 1134    Willy Eddy, MD 12/22/18 1225

## 2018-12-22 NOTE — Discharge Instructions (Signed)
Call make an appointment with your primary care provider if any continued problems or not improving.  You will need to also call if any pain medication is required.  Begin taking meloxicam 15 mg 1 daily with food.  You may also take Tylenol with this medication if needed for pain.  Avoid eating foods that require chewing as this increases irritation of the temporal mandibular joint.  Also avoid chewing chewing gum as we discussed.  For the next 2 to 3 days eat soft foods that do not require chewing which will increase your jaw improvement.

## 2020-02-25 ENCOUNTER — Other Ambulatory Visit: Payer: Self-pay

## 2020-02-25 ENCOUNTER — Inpatient Hospital Stay
Admission: RE | Admit: 2020-02-25 | Discharge: 2020-02-25 | Disposition: A | Discharge status: Home / Self Care | Payer: Managed Care, Other (non HMO) | Source: Ambulatory Visit

## 2020-02-25 ENCOUNTER — Ambulatory Visit
Admission: EM | Admit: 2020-02-25 | Discharge: 2020-02-25 | Disposition: A | Discharge status: Home / Self Care | Payer: Managed Care, Other (non HMO)

## 2020-02-25 DIAGNOSIS — H0102B Squamous blepharitis left eye, upper and lower eyelids: Secondary | ICD-10-CM | POA: Diagnosis not present

## 2020-02-25 MED ORDER — SYSTANE 0.4-0.3 % OP GEL: 1.0000 "application " | Freq: Every evening | OPHTHALMIC | 0 refills | Status: AC | PRN

## 2020-02-25 MED ORDER — SYSTANE 0.4-0.3 % OP SOLN: 1.0000 [drp] | Freq: Four times a day (QID) | OPHTHALMIC | 0 refills | Status: AC | PRN

## 2020-02-25 NOTE — Discharge Instructions (Addendum)
Artificial tear drops during the day. Artificial tear gel at night. Lid scrubs and warm compresses as directed. Monitor for any worsening of symptoms, changes in vision, sensitivity to light, eye swelling, painful eye movement, follow up with ophthalmology for further evaluation.

## 2020-02-25 NOTE — ED Triage Notes (Signed)
Patient presents with left eye pain that started yesterday.  She notes upon waking this morning her eye was watery, but now feel dry and scratchy.  She denies acute injury to her eye and eye was not matted this morning when she woke up.

## 2020-02-25 NOTE — ED Provider Notes (Signed)
[facility]    CSN: [id number] Arrival date & time: 02/25/20  0956      History   Chief Complaint Chief Complaint  Patient presents with  . Eye Problem    HPI Fontella Shan is a 39 y.o. female.   39 year old female comes in for 2-day history of left eye irritation, watering.  Patient states had some irritation yesterday with watering that improved throughout the day.  Woke up this morning with worsening symptoms, foreign body sensation.  This morning, had significant eye redness to the left eye that has now resolved.  Denies vision changes, photophobia.  Denies eye crusting.  Denies URI symptoms such as cough, congestion, sore throat.  Denies fever, chills, body aches.  Denies contact lens, glasses use.  Has not tried anything for the symptoms.     History reviewed. No pertinent past medical history.  There are no problems to display for this patient.   Past Surgical History:  Procedure Laterality Date  . TUBAL LIGATION      OB History   No obstetric history on file.      Home Medications    Prior to Admission medications   Medication Sig Start Date End Date Taking? Authorizing Provider  ondansetron (ZOFRAN-ODT) 4 MG disintegrating tablet Take 4 mg by mouth every 8 (eight) hours as needed for nausea or vomiting.   Yes [provider]  Polyethyl Glycol-Propyl Glycol (SYSTANE) 0.4-0.3 % GEL ophthalmic gel Place 1 application into both eyes at bedtime as needed. 02/25/20   Tasia Catchings, Carel Schnee V, PA-C  Polyethyl Glycol-Propyl Glycol (SYSTANE) 0.4-0.3 % SOLN Apply 1-2 drops to eye 4 (four) times daily as needed. 02/25/20   Ok Edwards, PA-C    Family History History reviewed. No pertinent family history.  Social History Social History   Tobacco Use  . Smoking status: Never Smoker  . Smokeless tobacco: Never Used  Substance Use Topics  . Alcohol use: No    Alcohol/week: 0.0 standard drinks  . Drug use: No     Allergies   Patient has no known  allergies.   Review of Systems Review of Systems  Reason unable to perform ROS: See HPI as above.     Physical Exam Triage Vital Signs ED Triage Vitals  Enc Vitals Group     BP 02/25/20 1030 131/85     Pulse Rate 02/25/20 1030 82     Resp 02/25/20 1030 18     Temp 02/25/20 1030 98.2 F (36.8 C)     Temp Source 02/25/20 1030 Oral     SpO2 02/25/20 1030 98 %     Weight --      Height --      Head Circumference --      Peak Flow --      Pain Score 02/25/20 1043 8     Pain Loc --      Pain Edu? --      Excl. in [place]? --    No data found.  Updated Vital Signs BP 131/85 (BP Location: Left Arm)   Pulse 82   Temp 98.2 F (36.8 C) (Oral)   Resp 18   SpO2 98%   Visual Acuity Right Eye Distance:   Left Eye Distance:   Bilateral Distance:    Right Eye Near: R Near: 20/25 Left Eye Near:  L Near: 20/25 Bilateral Near:  20/20  Physical Exam Constitutional:      General: She is not in acute distress.  Appearance: She is well-developed. She is not ill-appearing, toxic-appearing or diaphoretic.  HENT:     Head: Normocephalic and atraumatic.  Eyes:     General: Lids are normal. Lids are everted, no foreign bodies appreciated.     Extraocular Movements: Extraocular movements intact.     Conjunctiva/sclera: Conjunctivae normal.     Right eye: Right conjunctiva is not injected.     Left eye: Left conjunctiva is not injected.     Pupils: Pupils are equal, round, and reactive to light.     Comments: No defect noted.  Fluorescein stain without uptake.  Neurological:     Mental Status: She is alert and oriented to person, place, and time.      UC Treatments / Results  Labs (all labs ordered are listed, but only abnormal results are displayed) Labs Reviewed - No data to display  EKG   Radiology No results found.  Procedures Procedures (including critical care time)  Medications Ordered in UC Medications - No data to display  Initial Impression / Assessment  and Plan / UC Course  I have reviewed the triage vital signs and the nursing notes.  Pertinent labs & imaging results that were available during my care of the patient were reviewed by me and considered in my medical decision making (see chart for details).    History and exam most consistent with blepharitis.  Fluorescein stain without uptake today.  Will start artificial tears, lid scrubs, warm compress.  Discussed if developing conjunctivitis symptoms, can call in ofloxacin 0.3 ophthalmic drop, 1 drop every 6 hours x 7 days.  Otherwise, return precautions given.  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 Provider   Polyethyl Glycol-Propyl Glycol (SYSTANE) 0.4-0.3 % GEL ophthalmic gel Place 1 application into both eyes at bedtime as needed. 10 mL Milburn Freeney V, PA-C   Polyethyl Glycol-Propyl Glycol (SYSTANE) 0.4-0.3 % SOLN Apply 1-2 drops to eye 4 (four) times daily as needed. 10 mL Belinda Fisher, PA-C     PDMP not reviewed this encounter.   Belinda Fisher, PA-C 02/25/20 1128

## 2020-03-27 ENCOUNTER — Emergency Department
Admission: EM | Admit: 2020-03-27 | Discharge: 2020-03-27 | Disposition: A | Discharge status: Home / Self Care | Payer: Managed Care, Other (non HMO) | Attending: Emergency Medicine | Admitting: Emergency Medicine

## 2020-03-27 ENCOUNTER — Emergency Department: Payer: Managed Care, Other (non HMO)

## 2020-03-27 ENCOUNTER — Other Ambulatory Visit: Payer: Self-pay

## 2020-03-27 DIAGNOSIS — R002 Palpitations: Secondary | ICD-10-CM

## 2020-03-27 DIAGNOSIS — E119 Type 2 diabetes mellitus without complications: Secondary | ICD-10-CM | POA: Diagnosis not present

## 2020-03-27 DIAGNOSIS — F419 Anxiety disorder, unspecified: Secondary | ICD-10-CM | POA: Insufficient documentation

## 2020-03-27 DIAGNOSIS — R42 Dizziness and giddiness: Secondary | ICD-10-CM | POA: Diagnosis not present

## 2020-03-27 HISTORY — DX: Depression, unspecified: F32.A

## 2020-03-27 HISTORY — DX: Type 2 diabetes mellitus without complications: E11.9

## 2020-03-27 LAB — COMPREHENSIVE METABOLIC PANEL
ALT: 23 U/L (ref 0–44)
AST: 22 U/L (ref 15–41)
Albumin: 3.8 g/dL (ref 3.5–5.0)
Alkaline Phosphatase: 73 U/L (ref 38–126)
Anion gap: 8 (ref 5–15)
BUN: 10 mg/dL (ref 6–20)
CO2: 25 mmol/L (ref 22–32)
Calcium: 8.6 mg/dL — ABNORMAL LOW (ref 8.9–10.3)
Chloride: 102 mmol/L (ref 98–111)
Creatinine, Ser: 0.81 mg/dL (ref 0.44–1.00)
GFR calc Af Amer: >60 mL/min (ref >60)
GFR calc non Af Amer: >60 mL/min (ref >60)
Glucose, Bld: 115 mg/dL — ABNORMAL HIGH (ref 70–99)
Potassium: 3.7 mmol/L (ref 3.5–5.1)
Sodium: 135 mmol/L (ref 135–145)
Total Bilirubin: 0.4 mg/dL (ref 0.3–1.2)
Total Protein: 8.1 g/dL (ref 6.5–8.1)

## 2020-03-27 LAB — CBC
HCT: 41.4 % (ref 36.0–46.0)
Hemoglobin: 12.9 g/dL (ref 12.0–15.0)
MCH: 24.7 pg — ABNORMAL LOW (ref 26.0–34.0)
MCHC: 31.2 g/dL (ref 30.0–36.0)
MCV: 79.3 fL — ABNORMAL LOW (ref 80.0–100.0)
Platelets: 321 10*3/uL (ref 150–400)
RBC: 5.22 MIL/uL — ABNORMAL HIGH (ref 3.87–5.11)
RDW: 15.8 % — ABNORMAL HIGH (ref 11.5–15.5)
WBC: 11.8 10*3/uL — ABNORMAL HIGH (ref 4.0–10.5)
nRBC: 0 % (ref 0.0–0.2)

## 2020-03-27 LAB — TROPONIN I (HIGH SENSITIVITY): Troponin I (High Sensitivity): 4 ng/L (ref <18)

## 2020-03-27 LAB — TSH: TSH: 3.584 u[IU]/mL (ref 0.350–4.500)

## 2020-03-27 LAB — T4, FREE: Free T4: 0.86 ng/dL (ref 0.61–1.12)

## 2020-03-27 MED ORDER — DIAZEPAM 5 MG PO TABS
10.0000 mg | ORAL_TABLET | Freq: Once | ORAL | Status: AC
  Administered 2020-03-27: 10 mg via ORAL
  Filled 2020-03-27: qty 2

## 2020-03-27 MED ORDER — LORAZEPAM 1 MG PO TABS: 1.0000 mg | ORAL_TABLET | Freq: Two times a day (BID) | ORAL | 0 refills | Status: AC | PRN

## 2020-03-27 NOTE — ED Triage Notes (Addendum)
Pt c/o heart palpitation, dizziness, lack of sleep for the past 4 days. States thinks it is related to new medication, Wellbutrin that was stopped  on Friday , states she started taking it about 3 weeks ago. States she is still having sx. States she called 911 on Friday and EMS came and stayed about refused to be transported to the ED after her heart rate calmed down.

## 2020-03-27 NOTE — ED Provider Notes (Signed)
[facility] Emergency Department Provider Note       Time seen: ----------------------------------------- 8:50 AM on 03/27/2020 -----------------------------------------   I have reviewed the triage vital signs and the nursing notes.  HISTORY   Chief Complaint Palpitations   HPI Maureen Ferguson is a 39 y.o. female with a history of depression, diabetes who presents to the ED for palpitations, dizziness and lack of sleep for the last 4 days.  Patient thinks it was related to new medication which was Wellbutrin.  She stopped this on Friday but still feels the symptoms.  EMS was called on Friday for similar symptoms.  Past Medical History:  Diagnosis Date  . Depression   . Diabetes mellitus without complication ([place])     There are no problems to display for this patient.   Past Surgical History:  Procedure Laterality Date  . TUBAL LIGATION      Allergies Patient has no known allergies.  Social History Social History   Tobacco Use  . Smoking status: Never Smoker  . Smokeless tobacco: Never Used  Substance Use Topics  . Alcohol use: No    Alcohol/week: 0.0 standard drinks  . Drug use: No    Review of Systems Constitutional: Negative for fever. Cardiovascular: Negative for chest pain.  Positive for palpitations Respiratory: Negative for shortness of breath. Gastrointestinal: Negative for abdominal pain, vomiting and diarrhea. Musculoskeletal: Negative for back pain. Skin: Negative for rash. Neurological: Negative for headaches, focal weakness or numbness.  Positive for dizziness  All systems negative/normal/unremarkable except as stated in the HPI  ____________________________________________   PHYSICAL EXAM:  VITAL SIGNS: ED Triage Vitals  Enc Vitals Group     BP 03/27/20 0844 (!) 169/94     Pulse Rate 03/27/20 0844 81     Resp 03/27/20 0844 16     Temp 03/27/20 0844 98.3 F (36.8 C)     Temp Source 03/27/20 0844 Oral      SpO2 03/27/20 0844 100 %     Weight 03/27/20 0839 270 lb (122.5 kg)     Height 03/27/20 0839 5\' 3"  (1.6 m)     Head Circumference --      Peak Flow --      Pain Score 03/27/20 0839 0     Pain Loc --      Pain Edu? --      Excl. in [place]? --     Constitutional: Alert and oriented. Well appearing and in no distress. Eyes: Conjunctivae are normal. Normal extraocular movements. Cardiovascular: Normal rate, regular rhythm. No murmurs, rubs, or gallops. Respiratory: Normal respiratory effort without tachypnea nor retractions. Breath sounds are clear and equal bilaterally. No wheezes/rales/rhonchi. Gastrointestinal: Soft and nontender. Normal bowel sounds Musculoskeletal: Nontender with normal range of motion in extremities. No lower extremity tenderness nor edema. Neurologic:  Normal speech and language. No gross focal neurologic deficits are appreciated.  Skin:  Skin is warm, dry and intact. No rash noted. Psychiatric: Mood and affect are normal. Speech and behavior are normal.  ____________________________________________  EKG: Interpreted by me.  Sinus rhythm with rate of 82 bpm, normal PR interval, normal QRS, normal QT  ____________________________________________  ED COURSE:  As part of my medical decision making, I reviewed the following data within the [place] History obtained from family if available, nursing notes, old chart and ekg, as well as notes from prior ED visits. Patient presented for dizziness, we will assess with labs and imaging as indicated at this time.  Procedures  Maureen Ferguson was evaluated in Emergency Department on 03/27/2020 for the symptoms described in the history of present illness. She was evaluated in the context of the global COVID-19 pandemic, which necessitated consideration that the patient might be at risk for infection with the SARS-CoV-2 virus that causes COVID-19. Institutional protocols and algorithms that pertain to the  evaluation of patients at risk for COVID-19 are in a state of rapid change based on information released by regulatory bodies including the CDC and federal and state organizations. These policies and algorithms were followed during the patient's care in the ED.  ____________________________________________   LABS (pertinent positives/negatives)  Labs Reviewed  CBC - Abnormal; Notable for the following components:      Result Value   WBC 11.8 (*)    RBC 5.22 (*)    MCV 79.3 (*)    MCH 24.7 (*)    RDW 15.8 (*)    All other components within normal limits  COMPREHENSIVE METABOLIC PANEL - Abnormal; Notable for the following components:   Glucose, Bld 115 (*)    Calcium 8.6 (*)    All other components within normal limits  TSH  T4, FREE  TROPONIN I (HIGH SENSITIVITY)  TROPONIN I (HIGH SENSITIVITY)    RADIOLOGY Images were viewed by me  Chest x-ray IMPRESSION: Stable portable chest.  No evidence of active disease. ____________________________________________   DIFFERENTIAL DIAGNOSIS   Anxiety, panic attack, arrhythmia, MI, hypothyroidism  FINAL ASSESSMENT AND PLAN  Palpitations, anxiety   Plan: The patient had presented for palpitations and dizziness. Patient's labs did not reveal any acute process. Patient's imaging was reassuring.  Currently she feels better after oral Valium.  I will write for as needed Ativan and advise close outpatient follow-up with her doctor.   Ulice Dash, MD    Note: This note was generated in part or whole with voice recognition software. Voice recognition is usually quite accurate but there are transcription errors that can and very often do occur. I apologize for any typographical errors that were not detected and corrected.     Emily Filbert, MD 03/27/20 1029

## 2020-03-27 NOTE — ED Notes (Signed)
Patient ambulated from triage to ED Room 11.

## 2020-03-27 NOTE — ED Notes (Signed)
E-Pad for signatures is not working. IT has been called.  Patient unable to sign.

## 2020-03-30 ENCOUNTER — Ambulatory Visit: Payer: Managed Care, Other (non HMO) | Attending: Oncology

## 2020-03-30 DIAGNOSIS — Z23 Encounter for immunization: Secondary | ICD-10-CM

## 2020-03-30 NOTE — Progress Notes (Signed)
   Covid-19 Vaccination Clinic  Name:  Tom Ragsdale    MRN: [id number] DOB: 1981-08-23  03/30/2020  Ms. Lasseter was observed post Covid-19 immunization for 15 minutes without incident. She was provided with Vaccine Information Sheet and instruction to access the V-Safe system.   Ms. Regal was instructed to call 911 with any severe reactions post vaccine: Marland Kitchen Difficulty breathing  . Swelling of face and throat  . A fast heartbeat  . A bad rash all over body  . Dizziness and weakness   Immunizations Administered    Name Date Dose VIS Date Route   Pfizer COVID-19 Vaccine 03/30/2020  8:13 AM 0.3 mL 12/01/2019 Intramuscular   Manufacturer: [employer], [employer]   Lot: [id number]   NDC: [address]-1

## 2020-04-24 ENCOUNTER — Ambulatory Visit: Payer: Managed Care, Other (non HMO) | Attending: Internal Medicine

## 2020-04-24 DIAGNOSIS — Z23 Encounter for immunization: Secondary | ICD-10-CM

## 2020-04-24 NOTE — Progress Notes (Signed)
   Covid-19 Vaccination Clinic  Name:  Maureen Ferguson    MRN: [id number] DOB: [birth year]  04/24/2020  Maureen Ferguson was observed post Covid-19 immunization for 15 minutes without incident. She was provided with Vaccine Information Sheet and instruction to access the V-Safe system.   Maureen Ferguson was instructed to call 911 with any severe reactions post vaccine: Marland Kitchen Difficulty breathing  . Swelling of face and throat  . A fast heartbeat  . A bad rash all over body  . Dizziness and weakness   Immunizations Administered    Name Date Dose VIS Date Route   Pfizer COVID-19 Vaccine 04/24/2020 10:59 AM 0.3 mL 02/14/2019 Intramuscular   Manufacturer: [employer], [employer]   Lot: [id number]   NDC: [address]-9

## 2020-09-19 ENCOUNTER — Emergency Department
Admission: EM | Admit: 2020-09-19 | Discharge: 2020-09-19 | Disposition: A | Discharge status: Home / Self Care | Payer: Managed Care, Other (non HMO) | Attending: Emergency Medicine | Admitting: Emergency Medicine

## 2020-09-19 ENCOUNTER — Other Ambulatory Visit: Payer: Self-pay

## 2020-09-19 DIAGNOSIS — Z5321 Procedure and treatment not carried out due to patient leaving prior to being seen by health care provider: Secondary | ICD-10-CM | POA: Diagnosis not present

## 2020-09-19 DIAGNOSIS — R1110 Vomiting, unspecified: Secondary | ICD-10-CM | POA: Insufficient documentation

## 2020-09-19 DIAGNOSIS — R1084 Generalized abdominal pain: Secondary | ICD-10-CM | POA: Diagnosis present

## 2020-09-19 DIAGNOSIS — R531 Weakness: Secondary | ICD-10-CM | POA: Diagnosis not present

## 2020-09-19 LAB — GLUCOSE, CAPILLARY: Glucose-Capillary: 131 mg/dL — ABNORMAL HIGH (ref 70–99)

## 2020-09-19 LAB — COMPREHENSIVE METABOLIC PANEL
ALT: 29 U/L (ref 0–44)
AST: 24 U/L (ref 15–41)
Albumin: 3.9 g/dL (ref 3.5–5.0)
Alkaline Phosphatase: 66 U/L (ref 38–126)
Anion gap: 11 (ref 5–15)
BUN: 12 mg/dL (ref 6–20)
CO2: 22 mmol/L (ref 22–32)
Calcium: 9.2 mg/dL (ref 8.9–10.3)
Chloride: 102 mmol/L (ref 98–111)
Creatinine, Ser: 0.86 mg/dL (ref 0.44–1.00)
GFR calc Af Amer: >60 mL/min (ref >60)
GFR calc non Af Amer: >60 mL/min (ref >60)
Glucose, Bld: 135 mg/dL — ABNORMAL HIGH (ref 70–99)
Potassium: 3.9 mmol/L (ref 3.5–5.1)
Sodium: 135 mmol/L (ref 135–145)
Total Bilirubin: 0.4 mg/dL (ref 0.3–1.2)
Total Protein: 7.9 g/dL (ref 6.5–8.1)

## 2020-09-19 LAB — CBC
HCT: 41 % (ref 36.0–46.0)
Hemoglobin: 12.8 g/dL (ref 12.0–15.0)
MCH: 24.6 pg — ABNORMAL LOW (ref 26.0–34.0)
MCHC: 31.2 g/dL (ref 30.0–36.0)
MCV: 78.8 fL — ABNORMAL LOW (ref 80.0–100.0)
Platelets: 380 10*3/uL (ref 150–400)
RBC: 5.2 MIL/uL — ABNORMAL HIGH (ref 3.87–5.11)
RDW: 15.8 % — ABNORMAL HIGH (ref 11.5–15.5)
WBC: 16.9 10*3/uL — ABNORMAL HIGH (ref 4.0–10.5)
nRBC: 0 % (ref 0.0–0.2)

## 2020-09-19 LAB — LIPASE, BLOOD: Lipase: 26 U/L (ref 11–51)

## 2020-09-19 NOTE — ED Triage Notes (Signed)
Pt states she has been taking a weight loss med for a few weeks, they increased dosage Wednesday. States since yesterday has had generalized abd pain and weakness. Pt has had vomiting today, no diarrhea. No hx of abd problems.

## 2021-08-11 ENCOUNTER — Ambulatory Visit
Admission: EM | Admit: 2021-08-11 | Discharge: 2021-08-11 | Disposition: A | Discharge status: Home / Self Care | Payer: Managed Care, Other (non HMO) | Attending: Emergency Medicine | Admitting: Emergency Medicine

## 2021-08-11 DIAGNOSIS — R197 Diarrhea, unspecified: Secondary | ICD-10-CM | POA: Insufficient documentation

## 2021-08-11 DIAGNOSIS — R112 Nausea with vomiting, unspecified: Secondary | ICD-10-CM | POA: Diagnosis not present

## 2021-08-11 DIAGNOSIS — R519 Headache, unspecified: Secondary | ICD-10-CM | POA: Insufficient documentation

## 2021-08-11 DIAGNOSIS — R1084 Generalized abdominal pain: Secondary | ICD-10-CM | POA: Diagnosis not present

## 2021-08-11 DIAGNOSIS — E669 Obesity, unspecified: Secondary | ICD-10-CM | POA: Diagnosis not present

## 2021-08-11 DIAGNOSIS — Z20822 Contact with and (suspected) exposure to covid-19: Secondary | ICD-10-CM | POA: Insufficient documentation

## 2021-08-11 HISTORY — DX: Gastro-esophageal reflux disease without esophagitis: K21.9

## 2021-08-11 HISTORY — DX: Nausea with vomiting, unspecified: R11.2

## 2021-08-11 LAB — CBC WITH DIFFERENTIAL/PLATELET
Abs Immature Granulocytes: 0.03 10*3/uL (ref 0.00–0.07)
Basophils Absolute: 0 10*3/uL (ref 0.0–0.1)
Basophils Relative: 0 %
Eosinophils Absolute: 0.1 10*3/uL (ref 0.0–0.5)
Eosinophils Relative: 1 %
HCT: 41.4 % (ref 36.0–46.0)
Hemoglobin: 13.1 g/dL (ref 12.0–15.0)
Immature Granulocytes: 0 %
Lymphocytes Relative: 22 %
Lymphs Abs: 2.3 10*3/uL (ref 0.7–4.0)
MCH: 24.6 pg — ABNORMAL LOW (ref 26.0–34.0)
MCHC: 31.6 g/dL (ref 30.0–36.0)
MCV: 77.7 fL — ABNORMAL LOW (ref 80.0–100.0)
Monocytes Absolute: 0.6 10*3/uL (ref 0.1–1.0)
Monocytes Relative: 6 %
Neutro Abs: 7.1 10*3/uL (ref 1.7–7.7)
Neutrophils Relative %: 71 %
Platelets: 311 10*3/uL (ref 150–400)
RBC: 5.33 MIL/uL — ABNORMAL HIGH (ref 3.87–5.11)
RDW: 16.1 % — ABNORMAL HIGH (ref 11.5–15.5)
WBC: 10.2 10*3/uL (ref 4.0–10.5)
nRBC: 0 % (ref 0.0–0.2)

## 2021-08-11 LAB — COMPREHENSIVE METABOLIC PANEL
ALT: 42 U/L (ref 0–44)
AST: 38 U/L (ref 15–41)
Albumin: 3.6 g/dL (ref 3.5–5.0)
Alkaline Phosphatase: 77 U/L (ref 38–126)
Anion gap: 6 (ref 5–15)
BUN: 10 mg/dL (ref 6–20)
CO2: 25 mmol/L (ref 22–32)
Calcium: 8.6 mg/dL — ABNORMAL LOW (ref 8.9–10.3)
Chloride: 100 mmol/L (ref 98–111)
Creatinine, Ser: 0.92 mg/dL (ref 0.44–1.00)
GFR, Estimated: >60 mL/min (ref >60)
Glucose, Bld: 93 mg/dL (ref 70–99)
Potassium: 3.4 mmol/L — ABNORMAL LOW (ref 3.5–5.1)
Sodium: 131 mmol/L — ABNORMAL LOW (ref 135–145)
Total Bilirubin: 0.5 mg/dL (ref 0.3–1.2)
Total Protein: 8.3 g/dL — ABNORMAL HIGH (ref 6.5–8.1)

## 2021-08-11 MED ORDER — PROMETHAZINE HCL 25 MG RE SUPP: 25.0000 mg | Freq: Four times a day (QID) | RECTAL | 0 refills | Status: AC | PRN

## 2021-08-11 MED ORDER — PROMETHAZINE HCL 25 MG PO TABS: 25.0000 mg | ORAL_TABLET | Freq: Four times a day (QID) | ORAL | 0 refills | Status: AC | PRN

## 2021-08-11 NOTE — Discharge Instructions (Addendum)
You may continue to use the Zofran as needed for nausea and vomiting.  If the Zofran is not working to help control your nausea and vomiting you may use 1/2-1 Phenergan tablet every 4-6 hours.  This may make you little bit drowsy.  If you are actively vomiting you can use Phenergan suppository.  This can be inserted vaginally or rectally as needed for nausea and vomiting.  I recommend you keep the suppositories in your fridge.  Continue to rehydrate with small frequent sips of clear fluids such as broth, ginger ale, Pedialyte, or water.  If you are unable to rehydrate by mouth, the Phenergan or Zofran to control your nausea and vomiting, or you become dizzy I recommend going to the emergency department for evaluation where they can do IV rehydration.  If your COVID test comes back positive we will prescribe the antiviral therapy Paxlovid for you which she will take twice a day for 5 days.

## 2021-08-11 NOTE — ED Triage Notes (Addendum)
Patient presents to Urgent Care with complaints of headache, diarrhea (states having 1 episode every hour), vomiting, abdominal pain since yesterday. Treating symptoms zofran and pepto. Pt has a hx of acid reflux. She states she has endoscopy in Sept.   Denies fever, no changes in meds orr diet

## 2021-08-11 NOTE — ED Provider Notes (Addendum)
[facility]    CSN: [id number] Arrival date & time: 08/11/21  1135      History   Chief Complaint Chief Complaint  Patient presents with   Abdominal Pain   Headache   Diarrhea    HPI Maureen Ferguson is a 40 y.o. female.   HPI  40 year old female here for evaluation of GI complaints.  Patient reports that she developed nausea and vomiting yesterday followed by diarrhea and then a headache.  She reports that she had 3-4 episodes of vomiting yesterday and she is only had 1 today.  Her diarrhea vary in volume from small to moderate and are occurring about once every hour.  She has had a decreased appetite and reports that she has not been able to take and keep down any fluids by mouth.  She has generalized abdominal pain and cramping when she is going to vomit or have a bowel movement but not at rest.  Patient denies fever, blood in her stool or vomit, body aches, runny nose, nasal congestion, or cough.  She denies any recent travel.  Her daughter had COVID a month ago but she does not know of any recent sick contacts.  She also does not remember eating anything suspect or that tasted funny prior to her symptoms starting.  Patient does have a history of acid reflux and obesity.  She has a consult with [facility] to discuss treatment options but she is not under care of bariatrics at present.  She has been using Zofran tablets at home to help control her nausea and vomiting with limited results.  Past Medical History:  Diagnosis Date   Acid reflux    Depression    Diabetes mellitus without complication (HCC)    Nausea & vomiting     There are no problems to display for this patient.   Past Surgical History:  Procedure Laterality Date   TUBAL LIGATION      OB History   No obstetric history on file.      Home Medications    Prior to Admission medications   Medication Sig Start Date End Date Taking? Authorizing Provider  promethazine (PHENERGAN)  25 MG suppository Place 1 suppository (25 mg total) rectally every 6 (six) hours as needed for nausea or vomiting. 08/11/21  Yes Becky Augusta, NP  promethazine (PHENERGAN) 25 MG tablet Take 1 tablet (25 mg total) by mouth every 6 (six) hours as needed for nausea or vomiting. 08/11/21  Yes Becky Augusta, NP  ondansetron (ZOFRAN-ODT) 4 MG disintegrating tablet Take 4 mg by mouth every 8 (eight) hours as needed for nausea or vomiting.    [provider]  Polyethyl Glycol-Propyl Glycol (SYSTANE) 0.4-0.3 % GEL ophthalmic gel Place 1 application into both eyes at bedtime as needed. 02/25/20   Cathie Hoops, Amy V, PA-C  Polyethyl Glycol-Propyl Glycol (SYSTANE) 0.4-0.3 % SOLN Apply 1-2 drops to eye 4 (four) times daily as needed. 02/25/20   Belinda Fisher, PA-C    Family History History reviewed. No pertinent family history.  Social History Social History   Tobacco Use   Smoking status: Never   Smokeless tobacco: Never  Vaping Use   Vaping Use: Never used  Substance Use Topics   Alcohol use: No    Alcohol/week: 0.0 standard drinks   Drug use: No     Allergies   Bupropion   Review of Systems Review of Systems  Constitutional:  Positive for appetite change. Negative for activity change and fever.  HENT:  Negative for congestion and rhinorrhea.   Respiratory:  Negative for cough.   Gastrointestinal:  Positive for abdominal pain, diarrhea, nausea and vomiting. Negative for anal bleeding, blood in stool and constipation.  Skin:  Negative for rash.  Neurological:  Positive for headaches.  Hematological: Negative.   Psychiatric/Behavioral: Negative.      Physical Exam Triage Vital Signs ED Triage Vitals [08/11/21 1158]  Enc Vitals Group     BP (!) 130/93     Pulse Rate 94     Resp 16     Temp 98.5 F (36.9 C)     Temp Source Oral     SpO2 100 %     Weight      Height      Head Circumference      Peak Flow      Pain Score      Pain Loc      Pain Edu?      Excl. in GC?    No data  found.  Updated Vital Signs BP (!) 130/93 (BP Location: Left Arm)   Pulse 94   Temp 98.5 F (36.9 C) (Oral)   Resp 16   SpO2 100%   Visual Acuity Right Eye Distance:   Left Eye Distance:   Bilateral Distance:    Right Eye Near:   Left Eye Near:    Bilateral Near:     Physical Exam Vitals and nursing note reviewed.  Constitutional:      General: She is not in acute distress.    Appearance: She is well-developed. She is obese. She is not ill-appearing.  HENT:     Head: Normocephalic and atraumatic.     Mouth/Throat:     Mouth: Mucous membranes are moist.     Pharynx: Oropharynx is clear. No pharyngeal swelling or oropharyngeal exudate.  Eyes:     General: No scleral icterus.    Extraocular Movements: Extraocular movements intact.     Pupils: Pupils are equal, round, and reactive to light.  Cardiovascular:     Rate and Rhythm: Normal rate and regular rhythm.     Heart sounds: Normal heart sounds. No murmur heard.   No gallop.  Pulmonary:     Effort: Pulmonary effort is normal.     Breath sounds: Normal breath sounds. No wheezing, rhonchi or rales.  Abdominal:     General: Abdomen is protuberant. Bowel sounds are normal.     Palpations: Abdomen is rigid. There is no hepatomegaly or splenomegaly.     Tenderness: There is no abdominal tenderness. There is no right CVA tenderness or left CVA tenderness.  Skin:    General: Skin is warm and dry.     Capillary Refill: Capillary refill takes less than 2 seconds.     Findings: No erythema or rash.  Neurological:     General: No focal deficit present.     Mental Status: She is alert and oriented to person, place, and time.  Psychiatric:        Mood and Affect: Mood normal.        Behavior: Behavior normal.     UC Treatments / Results  Labs (all labs ordered are listed, but only abnormal results are displayed) Labs Reviewed  CBC WITH DIFFERENTIAL/PLATELET - Abnormal; Notable for the following components:      Result  Value   RBC 5.33 (*)    MCV 77.7 (*)    MCH 24.6 (*)    RDW 16.1 (*)  All other components within normal limits  COMPREHENSIVE METABOLIC PANEL - Abnormal; Notable for the following components:   Sodium 131 (*)    Potassium 3.4 (*)    Calcium 8.6 (*)    Total Protein 8.3 (*)    All other components within normal limits  SARS CORONAVIRUS 2 (TAT 6-24 HRS)    EKG   Radiology No results found.  Procedures Procedures (including critical care time)  Medications Ordered in UC Medications - No data to display  Initial Impression / Assessment and Plan / UC Course  I have reviewed the triage vital signs and the nursing notes.  Pertinent labs & imaging results that were available during my care of the patient were reviewed by me and considered in my medical decision making (see chart for details).  Patient is a nontoxic-appearing 40 year old female here for evaluation of nausea, vomiting, diarrhea, and headache that started yesterday.  She reports that she has been using Zofran with limited success to control her nausea and vomiting.  She has not been able to take in or keep down fluids of any kind.  She reports having 1 varying volume of diarrhea stool every hour since yesterday.  She had a total of 4 episodes of vomiting since yesterday.  She denies blood in either 1.  Patient's physical exam reveals moist and shiny ocular sclera and pink and moist oral mucosa.  Cardiopulmonary exam reveals clear lung sounds in all fields.  Patient's abdomen is protuberant but is soft with positive bowel sounds in all quadrants.  There is no tenderness to palpation on exam.  The patient's skin turgor is normal.  Patient does not exhibit any signs of dehydration based on her physical exam or her vital signs.  Will check COVID, CBC and CMP.  If there are no electrolyte abnormalities or elevated white count indicating infection will discharge patient home to isolate pending the results of her COVID test.  The  Zofran has not been working for the patient at home so we will prescribe Phenergan tablets and suppositories for patient to use to help control nausea and vomiting so that she can orally rehydrate.  CBC does not demonstrate an increased white count or signs of anemia.  Patient has a mildly increased RBC count which may represent hemodilution.  Platelets are normal at 311.  CMP shows mild hyponatremia with a sodium of 131, hypokalemia with a potassium of 3.4, normal renal function and transaminases.  Will discharge patient home with Phenergan tablets and suppositories to help with nausea and vomiting and have her continue oral rehydration.  If she continues to have vomiting and diarrhea and is unable to keep down fluids I would recommend she go to the ER for IV hydration.  We will also have patient isolate pending the results of her COVID test.  If she is COVID-positive will prescribe Paxlovid.   Final Clinical Impressions(s) / UC Diagnoses   Final diagnoses:  Nausea, vomiting and diarrhea     Discharge Instructions      You may continue to use the Zofran as needed for nausea and vomiting.  If the Zofran is not working to help control your nausea and vomiting you may use 1/2-1 Phenergan tablet every 4-6 hours.  This may make you little bit drowsy.  If you are actively vomiting you can use Phenergan suppository.  This can be inserted vaginally or rectally as needed for nausea and vomiting.  I recommend you keep the suppositories in your fridge.  Continue  to rehydrate with small frequent sips of clear fluids such as broth, ginger ale, Pedialyte, or water.  If you are unable to rehydrate by mouth, the Phenergan or Zofran to control your nausea and vomiting, or you become dizzy I recommend going to the emergency department for evaluation where they can do IV rehydration.  If your COVID test comes back positive we will prescribe the antiviral therapy Paxlovid for you which she will take twice a  day for 5 days.     ED Prescriptions     Medication Sig Dispense Auth. Provider   promethazine (PHENERGAN) 25 MG suppository Place 1 suppository (25 mg total) rectally every 6 (six) hours as needed for nausea or vomiting. 12 each Becky Augusta, NP   promethazine (PHENERGAN) 25 MG tablet Take 1 tablet (25 mg total) by mouth every 6 (six) hours as needed for nausea or vomiting. 30 tablet Becky Augusta, NP      PDMP not reviewed this encounter.   Becky Augusta, NP 08/11/21 1423    Becky Augusta, NP 08/11/21 1426

## 2021-08-12 LAB — SARS CORONAVIRUS 2 (TAT 6-24 HRS): SARS Coronavirus 2: NEGATIVE

## 2022-07-08 ENCOUNTER — Emergency Department (HOSPITAL_BASED_OUTPATIENT_CLINIC_OR_DEPARTMENT_OTHER)
Admission: EM | Admit: 2022-07-08 | Discharge: 2022-07-08 | Disposition: A | Discharge status: Home / Self Care | Payer: Managed Care, Other (non HMO) | Attending: Emergency Medicine | Admitting: Emergency Medicine

## 2022-07-08 ENCOUNTER — Encounter (HOSPITAL_BASED_OUTPATIENT_CLINIC_OR_DEPARTMENT_OTHER): Payer: Self-pay

## 2022-07-08 ENCOUNTER — Other Ambulatory Visit: Payer: Self-pay

## 2022-07-08 DIAGNOSIS — R42 Dizziness and giddiness: Secondary | ICD-10-CM | POA: Diagnosis not present

## 2022-07-08 DIAGNOSIS — H9201 Otalgia, right ear: Secondary | ICD-10-CM | POA: Insufficient documentation

## 2022-07-08 MED ORDER — MECLIZINE HCL 25 MG PO TABS
50.0000 mg | ORAL_TABLET | Freq: Once | ORAL | Status: AC
  Administered 2022-07-08: 50 mg via ORAL
  Filled 2022-07-08: qty 2

## 2022-07-08 MED ORDER — CEFDINIR 300 MG PO CAPS: 300.0000 mg | ORAL_CAPSULE | Freq: Two times a day (BID) | ORAL | 0 refills | Status: AC

## 2022-07-08 MED ORDER — MECLIZINE HCL 25 MG PO TABS: 25.0000 mg | ORAL_TABLET | Freq: Three times a day (TID) | ORAL | 0 refills | Status: DC | PRN

## 2022-07-08 NOTE — ED Provider Notes (Signed)
[facility] EMERGENCY DEPT Provider Note   CSN: [id number] Arrival date & time: 07/08/22  2542     History  Chief Complaint  Patient presents with   Dizziness   Emesis   Otalgia    Maureen Ferguson is a 41 y.o. female present emergency department complaint of right ear fullness, muffled hearing, and vertigo.  She reports she was treated for a right ear infection 1 month ago in June with amoxicillin, and that her symptoms improved while taking antibiotics.  She has not had ear problems before.  She reports that again 4 days ago she began having the same sensation of fullness and pain in her right ear, intermittent muffled hearing, no loss of hearing or ringing in her ear, but does note that she has had vertigo, reporting that "it feels like a moving when I am not".  No prior history of vertigo.  She had a history of borderline diabetes that was treated with oral medications.  Denies history of hypertension or high cholesterol.  Denies stroke issues.  She reports that her vertigo has been making it difficult for her to get the work as she does need to drive to work.  She gets dizzy when she stands up or moves around.  HPI     Home Medications Prior to Admission medications   Medication Sig Start Date End Date Taking? Authorizing Provider  cefdinir (OMNICEF) 300 MG capsule Take 1 capsule (300 mg total) by mouth 2 (two) times daily for 7 days. 07/08/22 07/15/22 Yes Katonya Blecher, Kermit Balo, MD  meclizine (ANTIVERT) 25 MG tablet Take 1 tablet (25 mg total) by mouth 3 (three) times daily as needed for up to 21 doses for dizziness. 07/08/22  Yes Terald Sleeper, MD  ondansetron (ZOFRAN-ODT) 4 MG disintegrating tablet Take 4 mg by mouth every 8 (eight) hours as needed for nausea or vomiting.    [provider]  Polyethyl Glycol-Propyl Glycol (SYSTANE) 0.4-0.3 % GEL ophthalmic gel Place 1 application into both eyes at bedtime as needed. 02/25/20   Cathie Hoops, Amy V, PA-C  Polyethyl  Glycol-Propyl Glycol (SYSTANE) 0.4-0.3 % SOLN Apply 1-2 drops to eye 4 (four) times daily as needed. 02/25/20   Belinda Fisher, PA-C  promethazine (PHENERGAN) 25 MG suppository Place 1 suppository (25 mg total) rectally every 6 (six) hours as needed for nausea or vomiting. 08/11/21   Becky Augusta, NP  promethazine (PHENERGAN) 25 MG tablet Take 1 tablet (25 mg total) by mouth every 6 (six) hours as needed for nausea or vomiting. 08/11/21   Becky Augusta, NP      Allergies    Bupropion    Review of Systems   Review of Systems  Physical Exam Updated Vital Signs BP 128/85 (BP Location: Right Arm)   Pulse 90   Temp 98.3 F (36.8 C) (Oral)   SpO2 100%  Physical Exam Constitutional:      General: She is not in acute distress. HENT:     Head: Normocephalic and atraumatic.     Jaw: There is normal jaw occlusion.     Comments: Questionable right middle ear effusion compared to left    Right Ear: Tympanic membrane, ear canal and external ear normal. No drainage or swelling. There is no impacted cerumen. No foreign body. No mastoid tenderness. Tympanic membrane is not perforated or erythematous.     Left Ear: Tympanic membrane, ear canal and external ear normal. No drainage or swelling. There is no impacted cerumen. No foreign body. No  mastoid tenderness. Tympanic membrane is not perforated or erythematous.  Eyes:     Conjunctiva/sclera: Conjunctivae normal.     Pupils: Pupils are equal, round, and reactive to light.  Cardiovascular:     Rate and Rhythm: Normal rate and regular rhythm.     Pulses: Normal pulses.  Pulmonary:     Effort: Pulmonary effort is normal. No respiratory distress.  Abdominal:     General: There is no distension.     Tenderness: There is no abdominal tenderness.  Skin:    General: Skin is warm and dry.  Neurological:     General: No focal deficit present.     Mental Status: She is alert and oriented to person, place, and time. Mental status is at baseline.     Comments:  Unilateral right beating nystagmus  Psychiatric:        Mood and Affect: Mood normal.        Behavior: Behavior normal.     ED Results / Procedures / Treatments   Labs (all labs ordered are listed, but only abnormal results are displayed) Labs Reviewed - No data to display  EKG None  Radiology No results found.  Procedures Procedures    Medications Ordered in ED Medications  meclizine (ANTIVERT) tablet 50 mg (has no administration in time range)    ED Course/ Medical Decision Making/ A&P                           Medical Decision Making Risk Prescription drug management.   Patient is here with vertigo and right ear pain.  I suspect the vertigo is likely peripheral and related to the process with her ear.  This would be a process which affects the inner ear.  She does not have hearing loss or tinnitus, but I cannot exclude Mnire's disease at this time, therefore I recommended that she follow-up with an ENT specialist.  In the meantime we can try to treat her vertigo with Antivert, and given that her prior ear pain symptoms improved with antibiotics, and she does continue to have a small effusion of the right middle ear, I think a second course of antibiotics with cefdinir would be reasonable for 7 days.  I doubt malignant otitis externa, swimmer's ear, mastoiditis.  I have a low suspicion for central cause of vertigo such as stroke with this clinical presentation.  She does not have any other neurological deficits.  She does have unilateral nystagmus.        Final Clinical Impression(s) / ED Diagnoses Final diagnoses:  Right ear pain  Vertigo    Rx / DC Orders ED Discharge Orders          Ordered    meclizine (ANTIVERT) 25 MG tablet  3 times daily PRN        07/08/22 1055    cefdinir (OMNICEF) 300 MG capsule  2 times daily        07/08/22 1055              Jahaad Penado, Kermit Balo, MD 07/08/22 1100

## 2022-07-08 NOTE — ED Triage Notes (Signed)
Pt presents POV from home with Right ear pin x5 days, recently treated in June with Amoxicillin in June for fluid and ear infection.   Pt also reports dizziness x4 days associated with 2 episodes of emesis this am.   Pt unable to work this week d/t dizziness. PCP sent her to ED for further evaluation

## 2024-05-02 ENCOUNTER — Other Ambulatory Visit: Payer: Self-pay

## 2024-05-02 ENCOUNTER — Emergency Department (HOSPITAL_BASED_OUTPATIENT_CLINIC_OR_DEPARTMENT_OTHER)
Admission: EM | Admit: 2024-05-02 | Discharge: 2024-05-02 | Disposition: A | Discharge status: Home / Self Care | Attending: Emergency Medicine | Admitting: Emergency Medicine

## 2024-05-02 ENCOUNTER — Emergency Department (HOSPITAL_BASED_OUTPATIENT_CLINIC_OR_DEPARTMENT_OTHER)

## 2024-05-02 DIAGNOSIS — E119 Type 2 diabetes mellitus without complications: Secondary | ICD-10-CM | POA: Diagnosis not present

## 2024-05-02 DIAGNOSIS — H538 Other visual disturbances: Secondary | ICD-10-CM | POA: Diagnosis not present

## 2024-05-02 DIAGNOSIS — R0789 Other chest pain: Secondary | ICD-10-CM | POA: Insufficient documentation

## 2024-05-02 DIAGNOSIS — R079 Chest pain, unspecified: Secondary | ICD-10-CM | POA: Insufficient documentation

## 2024-05-02 DIAGNOSIS — R42 Dizziness and giddiness: Secondary | ICD-10-CM | POA: Insufficient documentation

## 2024-05-02 DIAGNOSIS — R110 Nausea: Secondary | ICD-10-CM | POA: Diagnosis not present

## 2024-05-02 DIAGNOSIS — R0602 Shortness of breath: Secondary | ICD-10-CM | POA: Insufficient documentation

## 2024-05-02 LAB — CBC
HCT: 36.8 % (ref 36.0–46.0)
Hemoglobin: 11.8 g/dL — ABNORMAL LOW (ref 12.0–15.0)
MCH: 24.9 pg — ABNORMAL LOW (ref 26.0–34.0)
MCHC: 32.1 g/dL (ref 30.0–36.0)
MCV: 77.8 fL — ABNORMAL LOW (ref 80.0–100.0)
Platelets: 328 10*3/uL (ref 150–400)
RBC: 4.73 MIL/uL (ref 3.87–5.11)
RDW: 15.8 % — ABNORMAL HIGH (ref 11.5–15.5)
WBC: 14.6 10*3/uL — ABNORMAL HIGH (ref 4.0–10.5)
nRBC: 0 % (ref 0.0–0.2)

## 2024-05-02 LAB — DIFFERENTIAL
Abs Immature Granulocytes: 0.07 10*3/uL (ref 0.00–0.07)
Basophils Absolute: 0 10*3/uL (ref 0.0–0.1)
Basophils Relative: 0 %
Eosinophils Absolute: 0.2 10*3/uL (ref 0.0–0.5)
Eosinophils Relative: 1 %
Immature Granulocytes: 1 %
Lymphocytes Relative: 27 %
Lymphs Abs: 4 10*3/uL (ref 0.7–4.0)
Monocytes Absolute: 0.6 10*3/uL (ref 0.1–1.0)
Monocytes Relative: 4 %
Neutro Abs: 9.8 10*3/uL — ABNORMAL HIGH (ref 1.7–7.7)
Neutrophils Relative %: 67 %

## 2024-05-02 LAB — TROPONIN T, HIGH SENSITIVITY: Troponin T High Sensitivity: <15 ng/L (ref <19)

## 2024-05-02 LAB — PREGNANCY, URINE: Preg Test, Ur: NEGATIVE

## 2024-05-02 LAB — COMPREHENSIVE METABOLIC PANEL WITH GFR
ALT: 19 U/L (ref 0–44)
AST: 19 U/L (ref 15–41)
Albumin: 3.7 g/dL (ref 3.5–5.0)
Alkaline Phosphatase: 98 U/L (ref 38–126)
Anion gap: 12 (ref 5–15)
BUN: 8 mg/dL (ref 6–20)
CO2: 24 mmol/L (ref 22–32)
Calcium: 9 mg/dL (ref 8.9–10.3)
Chloride: 102 mmol/L (ref 98–111)
Creatinine, Ser: 0.81 mg/dL (ref 0.44–1.00)
GFR, Estimated: >60 mL/min (ref >60)
Glucose, Bld: 129 mg/dL — ABNORMAL HIGH (ref 70–99)
Potassium: 3.5 mmol/L (ref 3.5–5.1)
Sodium: 137 mmol/L (ref 135–145)
Total Bilirubin: 0.2 mg/dL (ref 0.0–1.2)
Total Protein: 7.1 g/dL (ref 6.5–8.1)

## 2024-05-02 LAB — ETHANOL: Alcohol, Ethyl (B): <15 mg/dL (ref <15)

## 2024-05-02 LAB — D-DIMER, QUANTITATIVE: D-Dimer, Quant: 0.41 ug{FEU}/mL (ref 0.00–0.50)

## 2024-05-02 LAB — APTT: aPTT: 30 s (ref 24–36)

## 2024-05-02 LAB — PROTIME-INR
INR: 0.9 (ref 0.8–1.2)
Prothrombin Time: 12.7 s (ref 11.4–15.2)

## 2024-05-02 LAB — CBG MONITORING, ED: Glucose-Capillary: 133 mg/dL — ABNORMAL HIGH (ref 70–99)

## 2024-05-02 MED ORDER — MIDAZOLAM HCL 2 MG/2ML IJ SOLN
2.0000 mg | Freq: Once | INTRAMUSCULAR | Status: AC
  Administered 2024-05-02: 2 mg via INTRAVENOUS
  Filled 2024-05-02: qty 2

## 2024-05-02 MED ORDER — PROCHLORPERAZINE EDISYLATE 10 MG/2ML IJ SOLN
10.0000 mg | Freq: Once | INTRAMUSCULAR | Status: DC
  Filled 2024-05-02: qty 2

## 2024-05-02 MED ORDER — DIPHENHYDRAMINE HCL 50 MG/ML IJ SOLN
50.0000 mg | Freq: Once | INTRAMUSCULAR | Status: AC
  Administered 2024-05-02: 50 mg via INTRAVENOUS
  Filled 2024-05-02: qty 1

## 2024-05-02 MED ORDER — MECLIZINE HCL 25 MG PO TABS: 25.0000 mg | ORAL_TABLET | Freq: Three times a day (TID) | ORAL | 0 refills | Status: AC | PRN

## 2024-05-02 MED ORDER — SODIUM CHLORIDE 0.9% FLUSH: 3.0000 mL | Freq: Once | INTRAVENOUS | Status: DC

## 2024-05-02 NOTE — ED Provider Notes (Signed)
 [place] EMERGENCY DEPARTMENT AT [facility] Provider Note   CSN: [id number] Arrival date & time: 05/02/24  1478     History  Chief Complaint  Patient presents with   Dizziness   Chest Pain    Maureen Ferguson is a 43 y.o. female with a history of diabetes mellitus, acid reflux, and depression who presents the ED today for multiple concerns.  Patient reports intermittent episodes of blurred vision, dizziness, chest pain, and shortness of breath for the past several weeks.  She had an episode of blurred vision in both eyes for about an hour today with her dizziness.  States that she was on the phone with her daughter around this time and she was told that she was having slurred speech.  No slurred speech, confusion, vision changes, or weakness at this time.  Does endorse intermittent episodes of chest pain still.  Has some nausea without vomiting.  No fevers or abdominal pain.  States that she has never experienced all of the symptoms at the same time before.    Home Medications Prior to Admission medications   Medication Sig Start Date End Date Taking? Authorizing Provider  meclizine  (ANTIVERT ) 25 MG tablet Take 1 tablet (25 mg total) by mouth 3 (three) times daily as needed for dizziness. 05/02/24 06/01/24 Yes Sonnie Dusky, PA-C  ondansetron  (ZOFRAN -ODT) 4 MG disintegrating tablet Take 4 mg by mouth every 8 (eight) hours as needed for nausea or vomiting.    [provider]  Polyethyl Glycol-Propyl Glycol (SYSTANE) 0.4-0.3 % GEL ophthalmic gel Place 1 application into both eyes at bedtime as needed. 02/25/20   Wilhelmenia Harada, Amy V, PA-C  Polyethyl Glycol-Propyl Glycol (SYSTANE) 0.4-0.3 % SOLN Apply 1-2 drops to eye 4 (four) times daily as needed. 02/25/20   Wilhel[place]ia Harada, Amy V, PA-C  promethazine  (PHENERGAN ) 25 MG suppository Place 1 suppository (25 mg total) rectally every 6 (six) hours as needed for nausea or vomiting. 08/11/21   Kent Pear, NP  promethazine  (PHENERGAN ) 25 MG tablet Take 1  tablet (25 mg total) by mouth every 6 (six) hours as needed for nausea or vomiting. 08/11/21   Kent Pear, NP      Allergies    Bupropion    Review of Systems   Review of Systems  Cardiovascular:  Positive for chest pain.  Neurological:  Positive for dizziness.  All other systems reviewed and are negative.   Physical Exam Updated Vital Signs BP (!) 141/102   Pulse 94   Temp 98 F (36.7 C)   Resp 20   Ht 5\' 3"  (1.6 m)   Wt 117.9 kg   SpO2 98%   BMI 46.06 kg/m  Physical Exam Vitals and nursing note reviewed.  Constitutional:      General: She is not in acute distress.    Appearance: Normal appearance.  HENT:     Head: Normocephalic and atraumatic.     Mouth/Throat:     Mouth: Mucous membranes are moist.  Eyes:     Extraocular Movements: Extraocular movements intact.     Conjunctiva/sclera: Conjunctivae normal.     Pupils: Pupils are equal, round, and reactive to light.     Comments: No nystagmus  Cardiovascular:     Rate and Rhythm: Normal rate and regular rhythm.     Pulses: Normal pulses.     Heart sounds: Normal heart sounds.  Pulmonary:     Effort: Pulmonary effort is normal.     Breath sounds: Normal breath sounds.  Abdominal:  Palpations: Abdomen is soft.     Tenderness: There is no abdominal tenderness.  Musculoskeletal:        General: Normal range of motion.     Cervical back: Normal range of motion.  Skin:    General: Skin is warm and dry.     Findings: No rash.  Neurological:     General: No focal deficit present.     Mental Status: She is alert.     Sensory: No sensory deficit.     Motor: No weakness.     Gait: Gait normal.  Psychiatric:        Mood and Affect: Mood normal.        Behavior: Behavior normal.    ED Results / Procedures / Treatments   Labs (all labs ordered are listed, but only abnormal results are displayed) Labs Reviewed  CBC - Abnormal; Notable for the following components:      Result Value   WBC 14.6 (*)     Hemoglobin 11.8 (*)    MCV 77.8 (*)    MCH 24.9 (*)    RDW 15.8 (*)    All other components within normal limits  DIFFERENTIAL - Abnormal; Notable for the following components:   Neutro Abs 9.8 (*)    All other components within normal limits  COMPREHENSIVE METABOLIC PANEL WITH GFR - Abnormal; Notable for the following components:   Glucose, Bld 129 (*)    All other components within normal limits  CBG MONITORING, ED - Abnormal; Notable for the following components:   Glucose-Capillary 133 (*)    All other components within normal limits  PROTIME-INR  APTT  ETHANOL  PREGNANCY, URINE  D-DIMER, QUANTITATIVE  CBG MONITORING, ED  TROPONIN T, HIGH SENSITIVITY    EKG None  Radiology CT HEAD WO CONTRAST Result Date: 05/02/2024 CLINICAL DATA:  Neurological deficit. EXAM: CT HEAD WITHOUT CONTRAST TECHNIQUE: Contiguous axial images were obtained from the base of the skull through the vertex without intravenous contrast. RADIATION DOSE REDUCTION: This exam was performed according to the departmental dose-optimization program which includes automated exposure control, adjustment of the mA and/or kV according to patient size and/or use of iterative reconstruction technique. COMPARISON:  None Available. FINDINGS: Brain: No evidence of acute infarction, hemorrhage, hydrocephalus, extra-axial collection or mass lesion/mass effect. Vascular: No hyperdense vessel or unexpected calcification. Skull: Normal. Negative for fracture or focal lesion. Sinuses/Orbits: No acute finding. Other: None. IMPRESSION: No acute intracranial pathology. Electronically Signed   By: Virgle Grime M.D.   On: 05/02/2024 19:17    Procedures Procedures    Medications Ordered in ED Medications  sodium chloride flush (NS) 0.9 % injection 3 mL (has no administration in time range)  prochlorperazine (COMPAZINE) injection 10 mg (10 mg Intravenous Not Given 05/02/24 2016)  midazolam (VERSED) injection 2 mg (2 mg  Intravenous Given 05/02/24 2016)  diphenhydrAMINE (BENADRYL) injection 50 mg (50 mg Intravenous Given 05/02/24 2016)    ED Course/ Medical Decision Making/ A&P                                 Medical Decision Making Amount and/or Complexity of Data Reviewed Labs: ordered. Radiology: ordered.  Risk Prescription drug management.   This patient presents to the ED for concern of dizziness and chest pain, this involves an extensive number of treatment options, and is a complaint that carries with it a high risk of complications and morbidity.  Differential diagnosis includes: ACS, pulmonary embolism, stroke, BPPV, Mnire's disease, medication side effect, electrolyte abnormality, dehydration, anxiety, etc. Low suspicion for stroke - no acute neurologic deficits   Comorbidities  See HPI above   Additional History  Additional history obtained from prior records   Cardiac Monitoring / EKG  The patient was maintained on a cardiac monitor.  I personally viewed and interpreted the cardiac monitored which showed: NSR with a heart rate of 98 bpm.   Lab Tests  I ordered and personally interpreted labs.  The pertinent results include:   Negative troponin Negative d-dimer CMP and CBC are within normal limits PT/INR are reassuring Ethanol level less than 15   Imaging Studies  I ordered imaging studies including CT head  I independently visualized and interpreted imaging which showed:  No acute intracranial pathology I agree with the radiologist interpretation   Problem List / ED Course / Critical Interventions / Medication Management  Patient reports intermittent episodes of dizziness and blurry vision for the past several weeks.  States that when she was driving today symptoms occurred at the same time as well as having sharp stabbing chest pains and shortness of breath.  Additionally, she was informed her daughter in this time and she told patient that her speech was  slurring.  Denies blurred vision at this time.  No focal neurologic deficits.  Low suspicion for stroke.  Could possibly be due to anxiety. Patient staffed with my attending, Dr. Nanavati I ordered medications including: Versed and Benadryl for dizziness  Reevaluation of the patient after these medicines showed that the patient improved I have reviewed the patients home medicines and have made adjustments as needed   Social Determinants of Health  Access to healthcare   Test / Admission - Considered  Discussed plan with patient.  All questions were answered. She is stable and safe discharge home. Return precautions given.       Final Clinical Impression(s) / ED Diagnoses Final diagnoses:  Dizziness  Atypical chest pain    Rx / DC Orders ED Discharge Orders          Ordered    meclizine  (ANTIVERT ) 25 MG tablet  3 times daily PRN        05/02/24 2243    Ambulatory referral to Cardiology       Comments: If you have not heard from the Cardiology office within the next 72 hours please call [phone].   05/02/24 2251              Sonnie Dusky, PA-C 05/02/24 2329    Deatra Face, MD 05/03/24 0003

## 2024-05-02 NOTE — ED Notes (Signed)
 After giving benadryl patient became tachycardia anxious and complaining of burning sensation in chest.  Dr. Lewis Red notified

## 2024-05-02 NOTE — ED Notes (Signed)
 Patient resting [facility] rate decreased

## 2024-05-02 NOTE — ED Triage Notes (Signed)
 Pt POV reporting sharp stabbing upper abd pain that radiates to back that began earlier today. Also reporting blurred vision x2 weeks and episode of SOB and dizziness at 2pm, was on phone call with daughter and was experiencing slurred speech.

## 2024-05-02 NOTE — Discharge Instructions (Addendum)
 As discussed, your labs and imaging are reassuring.  I sent a prescription meclizine  to the pharmacy.  Take this times a day as needed for dizziness.  Follow-up with your primary care fighter in the next week for reevaluation of your symptoms.  I have sent ambulatory referral to cardiology to follow-up with for your atypical chest pain.  Return to the ED if your symptoms worsen in the interim.

## 2024-10-26 NOTE — Progress Notes (Incomplete)
  Cardiology Office Note:   Date:  10/26/2024  ID:  Maureen Ferguson, DOB 11/30/1981, MRN [id number] PCP: Pcp, No  [facility] Providers Cardiologist:  None { Chief Complaint: No chief complaint on file.     History of Present Illness:   Maureen Ferguson is a 43 y.o. female with a PMH of 2, obesity, and anxiety who presents as a new patient referral by Darryle Birmingham for the evaluation of chest pain.  Patient presented to the [facility] on 05/02/2024 with multiple complaints of blurry vision, dizziness, chest pain and shortness of breath.  Ischemic evaluation and D-dimer were negative.  ECG showed NSR. CT head showed no acute intracranial pathology.  She is referred to cardiology for ongoing evaluation.   Past Medical History:  Diagnosis Date   Acid reflux    Depression    Diabetes mellitus without complication (HCC)    Nausea & vomiting      Studies Reviewed:    EKG: ***           Risk Assessment/Calculations:   {Does this patient have ATRIAL FIBRILLATION?:[id number]} No BP recorded.  {Refresh Note OR Click here to enter BP  :1}***        Physical Exam:     VS:  There were no vitals taken for this visit. ***    Wt Readings from Last 3 Encounters:  05/02/24 260 lb (117.9 kg)  09/19/20 269 lb (122 kg)  03/27/20 270 lb (122.5 kg)     GEN: Well nourished, well developed, in no acute distress NECK: No JVD; No carotid bruits CARDIAC: ***RRR, no murmurs, rubs, gallops RESPIRATORY:  Clear to auscultation without rales, wheezing or rhonchi  ABDOMEN: Soft, non-tender, non-distended, normal bowel sounds EXTREMITIES:  Warm and well perfused, no edema; No deformity, 2+ radial pulses PSYCH: Normal mood and affect   Assessment & Plan       {Are you ordering a CV Procedure (e.g. stress test, cath, DCCV, TEE, etc)?   Press F2        :[id number]}   This note was written with the assistance of a dictation microphone or AI dictation software. Please excuse any typos  or grammatical errors.   Signed, Georganna Archer, MD 10/26/2024 7:13 PM    [facility]

## 2024-10-27 ENCOUNTER — Ambulatory Visit
Attending: Student in an Organized Health Care Education/Training Program | Admitting: Student in an Organized Health Care Education/Training Program
